# Patient Record
Sex: Female | Born: 1950 | Race: White | Hispanic: No | Smoking: Never smoker
Health system: Southern US, Community
[De-identification: ages and names within clinical notes are randomized; demographics above are authoritative.]

## PROBLEM LIST (undated history)

## (undated) DIAGNOSIS — G43909 Migraine, unspecified, not intractable, without status migrainosus: Secondary | ICD-10-CM

## (undated) DIAGNOSIS — M170 Bilateral primary osteoarthritis of knee: Secondary | ICD-10-CM

---

## 1980-04-04 HISTORY — PX: ABDOMINAL HYSTERECTOMY: SHX81

## 2001-09-13 ENCOUNTER — Encounter: Payer: Self-pay | Admitting: Emergency Medicine

## 2001-09-13 ENCOUNTER — Encounter: Payer: Self-pay | Admitting: Urology

## 2001-09-13 ENCOUNTER — Ambulatory Visit (HOSPITAL_COMMUNITY): Admission: AD | Admit: 2001-09-13 | Discharge: 2001-09-13 | Payer: Self-pay | Admitting: Urology

## 2001-09-13 ENCOUNTER — Emergency Department (HOSPITAL_COMMUNITY): Admission: EM | Admit: 2001-09-13 | Discharge: 2001-09-13 | Payer: Self-pay | Admitting: Emergency Medicine

## 2001-09-24 ENCOUNTER — Ambulatory Visit (HOSPITAL_BASED_OUTPATIENT_CLINIC_OR_DEPARTMENT_OTHER): Admission: RE | Admit: 2001-09-24 | Discharge: 2001-09-24 | Payer: Self-pay | Admitting: Urology

## 2001-09-24 ENCOUNTER — Encounter: Payer: Self-pay | Admitting: Urology

## 2004-10-19 ENCOUNTER — Ambulatory Visit: Payer: Self-pay | Admitting: Unknown Physician Specialty

## 2004-10-26 ENCOUNTER — Ambulatory Visit: Payer: Self-pay | Admitting: Unknown Physician Specialty

## 2005-01-13 ENCOUNTER — Ambulatory Visit: Payer: Self-pay | Admitting: Unknown Physician Specialty

## 2005-04-04 HISTORY — PX: BREAST BIOPSY: SHX20

## 2005-10-24 ENCOUNTER — Ambulatory Visit: Payer: Self-pay | Admitting: Unknown Physician Specialty

## 2005-11-08 ENCOUNTER — Ambulatory Visit: Payer: Self-pay | Admitting: Unknown Physician Specialty

## 2007-12-27 ENCOUNTER — Ambulatory Visit: Payer: Self-pay | Admitting: Unknown Physician Specialty

## 2008-12-22 ENCOUNTER — Ambulatory Visit: Payer: Self-pay | Admitting: Unknown Physician Specialty

## 2008-12-30 ENCOUNTER — Ambulatory Visit: Payer: Self-pay | Admitting: Unknown Physician Specialty

## 2009-04-04 HISTORY — PX: KNEE ARTHROPLASTY: SHX992

## 2010-01-15 ENCOUNTER — Inpatient Hospital Stay (HOSPITAL_COMMUNITY): Admission: RE | Admit: 2010-01-15 | Discharge: 2010-01-18 | Payer: Self-pay | Admitting: Orthopedic Surgery

## 2010-02-23 ENCOUNTER — Ambulatory Visit: Payer: Self-pay | Admitting: Unknown Physician Specialty

## 2010-06-16 LAB — CBC
HCT: 28.6 % — ABNORMAL LOW (ref 36.0–46.0)
Hemoglobin: 9.3 g/dL — ABNORMAL LOW (ref 12.0–15.0)
MCH: 30 pg (ref 26.0–34.0)
MCHC: 32.9 g/dL (ref 30.0–36.0)
MCV: 89.1 fL (ref 78.0–100.0)
Platelets: 173 10*3/uL (ref 150–400)
Platelets: 179 10*3/uL (ref 150–400)
RBC: 3.09 MIL/uL — ABNORMAL LOW (ref 3.87–5.11)
RBC: 3.21 MIL/uL — ABNORMAL LOW (ref 3.87–5.11)
RBC: 3.23 MIL/uL — ABNORMAL LOW (ref 3.87–5.11)
WBC: 8.8 10*3/uL (ref 4.0–10.5)
WBC: 8.8 10*3/uL (ref 4.0–10.5)

## 2010-06-16 LAB — BASIC METABOLIC PANEL
BUN: 4 mg/dL — ABNORMAL LOW (ref 6–23)
CO2: 30 mEq/L (ref 19–32)
Calcium: 8.1 mg/dL — ABNORMAL LOW (ref 8.4–10.5)
Calcium: 8.3 mg/dL — ABNORMAL LOW (ref 8.4–10.5)
Chloride: 104 mEq/L (ref 96–112)
Chloride: 105 mEq/L (ref 96–112)
Chloride: 107 mEq/L (ref 96–112)
Creatinine, Ser: 0.62 mg/dL (ref 0.4–1.2)
Creatinine, Ser: 0.71 mg/dL (ref 0.4–1.2)
GFR calc Af Amer: >60 mL/min (ref >60)
GFR calc Af Amer: >60 mL/min (ref >60)
GFR calc Af Amer: >60 mL/min (ref >60)
GFR calc non Af Amer: >60 mL/min (ref >60)
Glucose, Bld: 118 mg/dL — ABNORMAL HIGH (ref 70–99)
Potassium: 3.8 mEq/L (ref 3.5–5.1)
Potassium: 4.1 mEq/L (ref 3.5–5.1)
Sodium: 137 mEq/L (ref 135–145)
Sodium: 140 mEq/L (ref 135–145)

## 2010-06-17 LAB — URINE MICROSCOPIC-ADD ON

## 2010-06-17 LAB — URINE CULTURE
Colony Count: 75000
Culture  Setup Time: 201110101052

## 2010-06-17 LAB — ABO/RH: ABO/RH(D): A POS

## 2010-06-17 LAB — CBC
HCT: 38.4 % (ref 36.0–46.0)
Hemoglobin: 12.7 g/dL (ref 12.0–15.0)
MCH: 29.9 pg (ref 26.0–34.0)
MCHC: 33.1 g/dL (ref 30.0–36.0)
MCV: 90.4 fL (ref 78.0–100.0)

## 2010-06-17 LAB — COMPREHENSIVE METABOLIC PANEL
ALT: 16 U/L (ref 0–35)
CO2: 31 mEq/L (ref 19–32)
Calcium: 9.3 mg/dL (ref 8.4–10.5)
Creatinine, Ser: 0.72 mg/dL (ref 0.4–1.2)
GFR calc non Af Amer: >60 mL/min (ref >60)
Glucose, Bld: 89 mg/dL (ref 70–99)
Sodium: 141 mEq/L (ref 135–145)

## 2010-06-17 LAB — TYPE AND SCREEN

## 2010-06-17 LAB — URINALYSIS, ROUTINE W REFLEX MICROSCOPIC
Bilirubin Urine: NEGATIVE
Ketones, ur: NEGATIVE mg/dL
Protein, ur: NEGATIVE mg/dL
Urobilinogen, UA: 0.2 mg/dL (ref 0.0–1.0)

## 2010-06-17 LAB — PROTIME-INR
INR: 0.95 (ref 0.00–1.49)
Prothrombin Time: 12.9 seconds (ref 11.6–15.2)

## 2010-06-17 LAB — DIFFERENTIAL
Eosinophils Absolute: 0.2 10*3/uL (ref 0.0–0.7)
Lymphs Abs: 2.7 10*3/uL (ref 0.7–4.0)
Neutrophils Relative %: 54 % (ref 43–77)

## 2010-08-20 NOTE — Op Note (Signed)
[facility]  Patient:    Erica Bernard, Erica Bernard Visit Number: [id number] MRN: [id number]          Service Type: DSU Location: DAY Attending Physician:  Monica Becton Dictated by:   Claudette Laws, M.D. Proc. Date: 09/13/01 Admit Date:  09/13/2001 Discharge Date: 09/13/2001                             Operative Report  PREOPERATIVE DIAGNOSIS:  A 7 x 4 mm mid right ureteral obstructing calculus with renal colic.  POSTOPERATIVE DIAGNOSIS:  A 7 x 4 mm mid right ureteral obstructing calculus with renal colic.  OPERATION PERFORMED:  Cystoscopy, right retrograde pyeloureterogram and insertion of a 6 French 26 cm double J stent.  SURGEON:  Claudette Laws, M.D.  HISTORY OF PRESENT ILLNESS:  This is a 61 year old lady who awoke early this morning with sided colicky pain and some nausea. She presented to the [facility] Room where a CAT scan showed an obstructing mid right ureteral calculus with a high grade right hydronephrosis. I then saw her in the office in the afternoon, we went over the findings. She was having a lot of pain and I recommended that we proceed to insertion of a double J stent. The nature of the procedure was explained to her including complications and she agrees to the proposed surgery. She has had no fever or chills. The patient otherwise is in good health except for a history of migraines and takes a beta blocker. Her urine in our office showed too numerous to count RBCs but no bacteriuria.  DESCRIPTION OF PROCEDURE:  The patient was prepped and draped in the dorsal lithotomy position under endotracheal general anesthesia. Cystoscopy showed a normal bladder but some erythema of the right ureteral orifice, no tumors, no other calculi, and normal left ureteral orifice.  After several attempts to try to intubate the right ureteral orifice with a Glidewire, I switched over to a 0.038 regular guidewire and I was able  to intubate the ureter. I passed up a 6 [place] open ended ureteral catheter and obtained a retrograde study which showed hydronephrosis in the stone in the mid ureter, a rather tortuous right ureter.  At this point, I switched back to the [facility] which went up easily into the right kidney using fluoroscopic control. I then went passed up a 6 [place] 26 cm double J stent. The proximal end was curled up in the renal pelvis, the distal end was curled up in the bladder. All instruments were removed, the bladder was emptied and the patient was taken back to the recovery room having tolerated the procedure well. A B&O suppository was placed per rectum preop for bladder spasms. The patient is to go home later on this evening. Dictated by:   Claudette Laws, M.D. Attending Physician:  Monica Becton DD:  09/13/01 TD:  09/15/01 Job: [id number]

## 2010-10-21 ENCOUNTER — Ambulatory Visit: Payer: Self-pay | Admitting: Unknown Physician Specialty

## 2011-03-08 ENCOUNTER — Ambulatory Visit: Payer: Self-pay | Admitting: Unknown Physician Specialty

## 2012-03-22 ENCOUNTER — Ambulatory Visit: Payer: Self-pay | Admitting: Unknown Physician Specialty

## 2012-11-19 ENCOUNTER — Encounter (HOSPITAL_COMMUNITY): Payer: Self-pay | Admitting: Pharmacy Technician

## 2012-11-20 ENCOUNTER — Other Ambulatory Visit: Payer: Self-pay | Admitting: Physician Assistant

## 2012-11-20 NOTE — H&P (Signed)
TOTAL KNEE ADMISSION H&P  Patient is being admitted for right total knee arthroplasty.  Subjective:  Chief Complaint:right knee pain.  HPI: Erica Bernard, 62 y.o. female, has a history of pain and functional disability in the right knee due to arthritis and has failed non-surgical conservative treatments for greater than 12 weeks to includeNSAID's and/or analgesics, corticosteriod injections, use of assistive devices and activity modification.  Onset of symptoms was gradual, starting 8 years ago with gradually worsening course since that time. The patient noted no past surgery on the right knee(s).  Patient currently rates pain in the right knee(s) at 8 out of 10 with activity. Patient has night pain, worsening of pain with activity and weight bearing, pain that interferes with activities of daily living, pain with passive range of motion and crepitus.  Patient has evidence of periarticular osteophytes, joint space narrowing and valgus deformity by imaging studies.  There is no active infection.  There are no active problems to display for this patient.  No past medical history on file.  No past surgical history on file.   (Not in a hospital admission) Allergies  Allergen Reactions  . Iodine Itching    Rash  . Sulfa Antibiotics Rash    Itching    History  Substance Use Topics  . Smoking status: Not on file  . Smokeless tobacco: Not on file  . Alcohol Use: Not on file    No family history on file.   Review of Systems  Constitutional: Negative.   HENT: Negative for hearing loss, ear pain, nosebleeds, congestion, sore throat, tinnitus and ear discharge.   Eyes: Negative.   Respiratory: Negative.  Negative for stridor.   Cardiovascular: Negative.   Gastrointestinal: Negative.   Genitourinary: Negative.   Musculoskeletal: Positive for joint pain. Negative for falls.  Skin: Negative.   Neurological: Positive for headaches. Negative for dizziness, tingling, tremors, sensory change,  speech change, focal weakness, seizures and loss of consciousness.  Endo/Heme/Allergies: Negative.   Psychiatric/Behavioral: Negative.     Objective:  Physical Exam  Constitutional: She is oriented to person, place, and time. She appears well-developed and well-nourished. No distress.  HENT:  Head: Normocephalic and atraumatic.  Nose: Nose normal.  Eyes: Conjunctivae and EOM are normal. Pupils are equal, round, and reactive to light.  Neck: Normal range of motion. Neck supple.  Cardiovascular: Normal rate, regular rhythm, normal heart sounds and intact distal pulses.   Respiratory: Effort normal and breath sounds normal. No respiratory distress. She has no wheezes. She has no rales.  GI: Soft. Bowel sounds are normal. She exhibits no distension. There is no tenderness.  Musculoskeletal:  RLE- dorsiflexion/plantarflexion ankle intact, SILT in all dermatomes, intact distal pulses, knee joint line tenderness, positive patellofemoral crepitus, stable ligamentous testing, valgus knee  Lymphadenopathy:    She has no cervical adenopathy.  Neurological: She is alert and oriented to person, place, and time. No cranial nerve deficit.  Skin: Skin is warm and dry. No rash noted. No erythema.  Psychiatric: She has a normal mood and affect. Her behavior is normal.    Vital signs in last 24 hours: @VSRANGES@  Labs:   There is no height or weight on file to calculate BMI.   Imaging Review Plain radiographs demonstrate severe degenerative joint disease of the right knee(s). The overall alignment issignificant valgus. The bone quality appears to be good for age and reported activity level.  Assessment/Plan:  End stage arthritis, right knee   The patient history, physical examination,   clinical judgment of the provider and imaging studies are consistent with end stage degenerative joint disease of the right knee(s) and total knee arthroplasty is deemed medically necessary. The treatment options  including medical management, injection therapy arthroscopy and arthroplasty were discussed at length. The risks and benefits of total knee arthroplasty were presented and reviewed. The risks due to aseptic loosening, infection, stiffness, patella tracking problems, thromboembolic complications and other imponderables were discussed. The patient acknowledged the explanation, agreed to proceed with the plan and consent was signed. Patient is being admitted for inpatient treatment for surgery, pain control, PT, OT, prophylactic antibiotics, VTE prophylaxis, progressive ambulation and ADL's and discharge planning. The patient is planning to be discharged to skilled nursing facility [facility] [place].  No Iodine to be used for prescrub due to allergy.     

## 2012-11-23 ENCOUNTER — Encounter (HOSPITAL_COMMUNITY)
Admission: RE | Admit: 2012-11-23 | Discharge: 2012-11-23 | Disposition: A | Discharge status: Home / Self Care | Payer: 59 | Source: Ambulatory Visit | Attending: Orthopedic Surgery | Admitting: Orthopedic Surgery

## 2012-11-23 ENCOUNTER — Ambulatory Visit (HOSPITAL_COMMUNITY)
Admission: RE | Admit: 2012-11-23 | Discharge: 2012-11-23 | Disposition: A | Discharge status: Home / Self Care | Payer: 59 | Source: Ambulatory Visit | Attending: Physician Assistant | Admitting: Physician Assistant

## 2012-11-23 ENCOUNTER — Encounter (HOSPITAL_COMMUNITY): Payer: Self-pay

## 2012-11-23 DIAGNOSIS — M1710 Unilateral primary osteoarthritis, unspecified knee: Secondary | ICD-10-CM | POA: Insufficient documentation

## 2012-11-23 DIAGNOSIS — Z01812 Encounter for preprocedural laboratory examination: Secondary | ICD-10-CM | POA: Insufficient documentation

## 2012-11-23 DIAGNOSIS — M4120 Other idiopathic scoliosis, site unspecified: Secondary | ICD-10-CM | POA: Insufficient documentation

## 2012-11-23 DIAGNOSIS — IMO0002 Reserved for concepts with insufficient information to code with codable children: Secondary | ICD-10-CM | POA: Insufficient documentation

## 2012-11-23 DIAGNOSIS — Z01818 Encounter for other preprocedural examination: Secondary | ICD-10-CM | POA: Insufficient documentation

## 2012-11-23 HISTORY — DX: Bilateral primary osteoarthritis of knee: M17.0

## 2012-11-23 HISTORY — DX: Migraine, unspecified, not intractable, without status migrainosus: G43.909

## 2012-11-23 LAB — PROTIME-INR: INR: 0.97 (ref 0.00–1.49)

## 2012-11-23 LAB — CBC WITH DIFFERENTIAL/PLATELET
Basophils Absolute: 0 10*3/uL (ref 0.0–0.1)
Basophils Relative: 1 % (ref 0–1)
Eosinophils Absolute: 0.1 10*3/uL (ref 0.0–0.7)
Eosinophils Relative: 2 % (ref 0–5)
HCT: 38.2 % (ref 36.0–46.0)
Lymphocytes Relative: 32 % (ref 12–46)
MCHC: 34.6 g/dL (ref 30.0–36.0)
MCV: 89.5 fL (ref 78.0–100.0)
Monocytes Absolute: 0.6 10*3/uL (ref 0.1–1.0)
Platelets: 263 10*3/uL (ref 150–400)
RDW: 13.4 % (ref 11.5–15.5)
WBC: 7.9 10*3/uL (ref 4.0–10.5)

## 2012-11-23 LAB — COMPREHENSIVE METABOLIC PANEL
ALT: 14 U/L (ref 0–35)
AST: 21 U/L (ref 0–37)
Albumin: 3.9 g/dL (ref 3.5–5.2)
CO2: 26 mEq/L (ref 19–32)
Calcium: 9.5 mg/dL (ref 8.4–10.5)
Creatinine, Ser: 0.67 mg/dL (ref 0.50–1.10)
GFR calc non Af Amer: >90 mL/min (ref >90)
Sodium: 139 mEq/L (ref 135–145)
Total Protein: 7.8 g/dL (ref 6.0–8.3)

## 2012-11-23 LAB — URINALYSIS, ROUTINE W REFLEX MICROSCOPIC
Glucose, UA: NEGATIVE mg/dL
Ketones, ur: NEGATIVE mg/dL
Protein, ur: NEGATIVE mg/dL
Urobilinogen, UA: 0.2 mg/dL (ref 0.0–1.0)

## 2012-11-23 LAB — TYPE AND SCREEN
ABO/RH(D): A POS
Antibody Screen: NEGATIVE

## 2012-11-23 LAB — URINE MICROSCOPIC-ADD ON

## 2012-11-23 LAB — SURGICAL PCR SCREEN
MRSA, PCR: NEGATIVE
Staphylococcus aureus: POSITIVE — AB

## 2012-11-23 NOTE — Pre-Procedure Instructions (Signed)
Annalyse Langlais Reader  11/23/2012   Your procedure is scheduled on:  August 29  Report to [facility] at 05:30 AM.  Call this number if you have problems the morning of surgery: [phone]   Remember:   Do not eat food or drink liquids after midnight.   Take these medicines the morning of surgery with A SIP OF WATER: Nadolol, Fioricet (if needed)     Do not take Aspirin, Aleve, Naproxen, Advil, Ibuprofen, Vitamin, Herbs, or Supplements starting today  Do not wear jewelry, make-up or nail polish.  Do not wear lotions, powders, or perfumes. You Whitcher wear deodorant.  Do not shave 48 hours prior to surgery. Men Gombert shave face and neck.  Do not bring valuables to the hospital.  [facility] is not responsible                   for any belongings or valuables.  Contacts, dentures or bridgework Harnisch not be worn into surgery.  Leave suitcase in the car. After surgery it Brister be brought to your room.  For patients admitted to the hospital, checkout time is 11:00 AM the day of  discharge.   Special Instructions: Shower using CHG 2 nights before surgery and the night before surgery.  If you shower the day of surgery use CHG.  Use special wash - you have one bottle of CHG for all showers.  You should use approximately 1/3 of the bottle for each shower.   Please read over the following fact sheets that you were given: Pain Booklet, Coughing and Deep Breathing, Blood Transfusion Information, Total Joint Packet, MRSA Information and Surgical Site Infection Prevention

## 2012-11-23 NOTE — Progress Notes (Signed)
Requested 2 most recent EKG from [facility] clinic and last OV per copy of clearance note that pt brought in she has changes on EKG that have been present for > 5 years

## 2012-11-24 LAB — URINE CULTURE

## 2012-11-29 MED ORDER — CEFAZOLIN SODIUM-DEXTROSE 2-3 GM-% IV SOLR
2.0000 g | INTRAVENOUS | Status: AC
  Administered 2012-11-30: 2 g via INTRAVENOUS
  Filled 2012-11-29: qty 50

## 2012-11-30 ENCOUNTER — Encounter (HOSPITAL_COMMUNITY): Payer: Self-pay | Admitting: Anesthesiology

## 2012-11-30 ENCOUNTER — Ambulatory Visit (HOSPITAL_COMMUNITY): Payer: 59 | Admitting: Anesthesiology

## 2012-11-30 ENCOUNTER — Encounter (HOSPITAL_COMMUNITY): Payer: Self-pay | Admitting: *Deleted

## 2012-11-30 ENCOUNTER — Inpatient Hospital Stay (HOSPITAL_COMMUNITY)
Admission: RE | Admit: 2012-11-30 | Discharge: 2012-12-04 | DRG: 470 | Disposition: A | Discharge status: Skilled Nursing Facility | Payer: 59 | Source: Ambulatory Visit | Attending: Orthopedic Surgery | Admitting: Orthopedic Surgery

## 2012-11-30 ENCOUNTER — Encounter (HOSPITAL_COMMUNITY)
Admission: RE | Disposition: A | Discharge status: Skilled Nursing Facility | Payer: Self-pay | Source: Ambulatory Visit | Attending: Orthopedic Surgery

## 2012-11-30 DIAGNOSIS — Z79899 Other long term (current) drug therapy: Secondary | ICD-10-CM

## 2012-11-30 DIAGNOSIS — M1710 Unilateral primary osteoarthritis, unspecified knee: Principal | ICD-10-CM | POA: Diagnosis present

## 2012-11-30 DIAGNOSIS — D62 Acute posthemorrhagic anemia: Secondary | ICD-10-CM | POA: Diagnosis not present

## 2012-11-30 DIAGNOSIS — Z882 Allergy status to sulfonamides status: Secondary | ICD-10-CM

## 2012-11-30 DIAGNOSIS — Z7901 Long term (current) use of anticoagulants: Secondary | ICD-10-CM

## 2012-11-30 DIAGNOSIS — G43909 Migraine, unspecified, not intractable, without status migrainosus: Secondary | ICD-10-CM | POA: Diagnosis present

## 2012-11-30 HISTORY — PX: TOTAL KNEE ARTHROPLASTY: SHX125

## 2012-11-30 LAB — CBC
HCT: 34.6 % — ABNORMAL LOW (ref 36.0–46.0)
MCHC: 34.4 g/dL (ref 30.0–36.0)
MCV: 90.3 fL (ref 78.0–100.0)
Platelets: 232 10*3/uL (ref 150–400)
RDW: 13.3 % (ref 11.5–15.5)
WBC: 15.2 10*3/uL — ABNORMAL HIGH (ref 4.0–10.5)

## 2012-11-30 LAB — CREATININE, SERUM: GFR calc Af Amer: >90 mL/min (ref >90)

## 2012-11-30 SURGERY — ARTHROPLASTY, KNEE, TOTAL
Anesthesia: Regional | Site: Knee | Laterality: Right | Wound class: Clean

## 2012-11-30 MED ORDER — HYDROMORPHONE HCL PF 1 MG/ML IJ SOLN: 0.2500 mg | INTRAMUSCULAR | Status: DC | PRN

## 2012-11-30 MED ORDER — MIDAZOLAM HCL 2 MG/2ML IJ SOLN: 0.5000 mg | Freq: Once | INTRAMUSCULAR | Status: DC | PRN

## 2012-11-30 MED ORDER — DOCUSATE SODIUM 100 MG PO CAPS
100.0000 mg | ORAL_CAPSULE | Freq: Two times a day (BID) | ORAL | Status: DC
  Administered 2012-11-30 – 2012-12-04 (×9): 100 mg via ORAL
  Filled 2012-11-30 (×10): qty 1

## 2012-11-30 MED ORDER — TRANEXAMIC ACID 100 MG/ML IV SOLN
1000.0000 mg | INTRAVENOUS | Status: AC
  Administered 2012-11-30: 1000 mg via INTRAVENOUS
  Filled 2012-11-30: qty 10

## 2012-11-30 MED ORDER — SODIUM CHLORIDE 0.9 % IR SOLN
Status: DC | PRN
  Administered 2012-11-30: 3000 mL

## 2012-11-30 MED ORDER — CHLORHEXIDINE GLUCONATE 4 % EX LIQD: 60.0000 mL | Freq: Once | CUTANEOUS | Status: DC

## 2012-11-30 MED ORDER — PROPOFOL 10 MG/ML IV BOLUS
INTRAVENOUS | Status: DC | PRN
  Administered 2012-11-30: 50 mg via INTRAVENOUS
  Administered 2012-11-30: 200 mg via INTRAVENOUS

## 2012-11-30 MED ORDER — ACETAMINOPHEN 325 MG PO TABS
650.0000 mg | ORAL_TABLET | Freq: Four times a day (QID) | ORAL | Status: DC | PRN
  Administered 2012-12-01 – 2012-12-03 (×4): 650 mg via ORAL
  Filled 2012-11-30 (×4): qty 2

## 2012-11-30 MED ORDER — CEFAZOLIN SODIUM 1-5 GM-% IV SOLN
1.0000 g | Freq: Four times a day (QID) | INTRAVENOUS | Status: AC
  Administered 2012-11-30 (×2): 1 g via INTRAVENOUS
  Filled 2012-11-30 (×2): qty 50

## 2012-11-30 MED ORDER — ONDANSETRON HCL 4 MG PO TABS: 4.0000 mg | ORAL_TABLET | Freq: Four times a day (QID) | ORAL | Status: DC | PRN

## 2012-11-30 MED ORDER — BISACODYL 10 MG RE SUPP: 10.0000 mg | Freq: Every day | RECTAL | Status: DC | PRN

## 2012-11-30 MED ORDER — OXYCODONE HCL 5 MG/5ML PO SOLN: 5.0000 mg | Freq: Once | ORAL | Status: AC | PRN

## 2012-11-30 MED ORDER — NADOLOL 20 MG PO TABS
20.0000 mg | ORAL_TABLET | Freq: Every day | ORAL | Status: DC
  Administered 2012-11-30 – 2012-12-04 (×5): 20 mg via ORAL
  Filled 2012-11-30 (×5): qty 1

## 2012-11-30 MED ORDER — ARTIFICIAL TEARS OP OINT
TOPICAL_OINTMENT | OPHTHALMIC | Status: DC | PRN
  Administered 2012-11-30: 1 via OPHTHALMIC

## 2012-11-30 MED ORDER — PHENOL 1.4 % MT LIQD: 1.0000 | OROMUCOSAL | Status: DC | PRN

## 2012-11-30 MED ORDER — MENTHOL 3 MG MT LOZG: 1.0000 | LOZENGE | OROMUCOSAL | Status: DC | PRN

## 2012-11-30 MED ORDER — OXYCODONE HCL 5 MG PO TABS
ORAL_TABLET | ORAL | Status: AC
  Administered 2012-11-30: 13:00:00
  Filled 2012-11-30: qty 1

## 2012-11-30 MED ORDER — OXYCODONE HCL 5 MG PO TABS
5.0000 mg | ORAL_TABLET | ORAL | Status: DC | PRN
  Administered 2012-11-30: 5 mg via ORAL
  Administered 2012-11-30 – 2012-12-04 (×16): 10 mg via ORAL
  Filled 2012-11-30 (×16): qty 2
  Filled 2012-11-30: qty 1

## 2012-11-30 MED ORDER — FLEET ENEMA 7-19 GM/118ML RE ENEM: 1.0000 | ENEMA | Freq: Once | RECTAL | Status: AC | PRN

## 2012-11-30 MED ORDER — SODIUM CHLORIDE 0.9 % IV SOLN: INTRAVENOUS | Status: DC

## 2012-11-30 MED ORDER — LIDOCAINE HCL (CARDIAC) 20 MG/ML IV SOLN
INTRAVENOUS | Status: DC | PRN
  Administered 2012-11-30: 20 mg via INTRAVENOUS

## 2012-11-30 MED ORDER — ONDANSETRON HCL 4 MG/2ML IJ SOLN
4.0000 mg | Freq: Four times a day (QID) | INTRAMUSCULAR | Status: DC | PRN
  Administered 2012-11-30 – 2012-12-01 (×3): 4 mg via INTRAVENOUS
  Filled 2012-11-30 (×3): qty 2

## 2012-11-30 MED ORDER — PROMETHAZINE HCL 25 MG/ML IJ SOLN: 6.2500 mg | INTRAMUSCULAR | Status: DC | PRN

## 2012-11-30 MED ORDER — MEPERIDINE HCL 25 MG/ML IJ SOLN: 6.2500 mg | INTRAMUSCULAR | Status: DC | PRN

## 2012-11-30 MED ORDER — SENNOSIDES-DOCUSATE SODIUM 8.6-50 MG PO TABS: 1.0000 | ORAL_TABLET | Freq: Every evening | ORAL | Status: DC | PRN

## 2012-11-30 MED ORDER — METOCLOPRAMIDE HCL 5 MG PO TABS
5.0000 mg | ORAL_TABLET | Freq: Three times a day (TID) | ORAL | Status: DC | PRN
  Filled 2012-11-30: qty 2

## 2012-11-30 MED ORDER — SODIUM CHLORIDE 0.9 % IV SOLN
INTRAVENOUS | Status: DC
  Administered 2012-11-30: via INTRAVENOUS

## 2012-11-30 MED ORDER — MIDAZOLAM HCL 5 MG/5ML IJ SOLN
INTRAMUSCULAR | Status: DC | PRN
  Administered 2012-11-30: 2 mg via INTRAVENOUS

## 2012-11-30 MED ORDER — METOCLOPRAMIDE HCL 5 MG/ML IJ SOLN: 5.0000 mg | Freq: Three times a day (TID) | INTRAMUSCULAR | Status: DC | PRN

## 2012-11-30 MED ORDER — FENTANYL CITRATE 0.05 MG/ML IJ SOLN
INTRAMUSCULAR | Status: DC | PRN
  Administered 2012-11-30: 100 ug via INTRAVENOUS
  Administered 2012-11-30: 50 ug via INTRAVENOUS
  Administered 2012-11-30: 100 ug via INTRAVENOUS
  Administered 2012-11-30: 50 ug via INTRAVENOUS

## 2012-11-30 MED ORDER — LACTATED RINGERS IV SOLN
INTRAVENOUS | Status: DC | PRN
  Administered 2012-11-30 (×2): via INTRAVENOUS

## 2012-11-30 MED ORDER — 0.9 % SODIUM CHLORIDE (POUR BTL) OPTIME
TOPICAL | Status: DC | PRN
  Administered 2012-11-30: 1000 mL

## 2012-11-30 MED ORDER — ACETAMINOPHEN 650 MG RE SUPP: 650.0000 mg | Freq: Four times a day (QID) | RECTAL | Status: DC | PRN

## 2012-11-30 MED ORDER — BUTALBITAL-APAP-CAFFEINE 50-325-40 MG PO TABS: 1.0000 | ORAL_TABLET | Freq: Once | ORAL | Status: AC | PRN

## 2012-11-30 MED ORDER — ONDANSETRON HCL 4 MG/2ML IJ SOLN
INTRAMUSCULAR | Status: DC | PRN
  Administered 2012-11-30: 4 mg via INTRAVENOUS

## 2012-11-30 MED ORDER — ENOXAPARIN SODIUM 30 MG/0.3ML ~~LOC~~ SOLN
30.0000 mg | Freq: Two times a day (BID) | SUBCUTANEOUS | Status: DC
  Administered 2012-12-01 – 2012-12-04 (×7): 30 mg via SUBCUTANEOUS
  Filled 2012-11-30 (×9): qty 0.3

## 2012-11-30 MED ORDER — HYDROMORPHONE HCL PF 1 MG/ML IJ SOLN
1.0000 mg | INTRAMUSCULAR | Status: DC | PRN
  Administered 2012-12-01: 1 mg via INTRAVENOUS
  Filled 2012-11-30: qty 1

## 2012-11-30 MED ORDER — ATORVASTATIN CALCIUM 10 MG PO TABS
10.0000 mg | ORAL_TABLET | ORAL | Status: DC
  Administered 2012-12-02: 10 mg via ORAL
  Filled 2012-11-30 (×3): qty 1

## 2012-11-30 MED ORDER — BUPIVACAINE-EPINEPHRINE PF 0.5-1:200000 % IJ SOLN
INTRAMUSCULAR | Status: DC | PRN
  Administered 2012-11-30: 30 mL

## 2012-11-30 MED ORDER — OXYCODONE HCL 5 MG PO TABS
5.0000 mg | ORAL_TABLET | Freq: Once | ORAL | Status: AC | PRN
  Administered 2012-11-30: 5 mg via ORAL

## 2012-11-30 SURGICAL SUPPLY — 63 items
BANDAGE ELASTIC 4 VELCRO ST LF (GAUZE/BANDAGES/DRESSINGS) ×2 IMPLANT
BANDAGE ELASTIC 6 VELCRO ST LF (GAUZE/BANDAGES/DRESSINGS) ×2 IMPLANT
BANDAGE ESMARK 6X9 LF (GAUZE/BANDAGES/DRESSINGS) ×1 IMPLANT
BLADE SAGITTAL 25.0X1.19X90 (BLADE) ×2 IMPLANT
BLADE SAW SAG 90X13X1.27 (BLADE) ×2 IMPLANT
BNDG CMPR 9X6 STRL LF SNTH (GAUZE/BANDAGES/DRESSINGS) ×1
BNDG ESMARK 6X9 LF (GAUZE/BANDAGES/DRESSINGS) ×2
BOWL SMART MIX CTS (DISPOSABLE) ×2 IMPLANT
CAPT RP KNEE ×1 IMPLANT
CEMENT HV SMART SET (Cement) ×2 IMPLANT
CHLORAPREP W/TINT 26ML (MISCELLANEOUS) ×1 IMPLANT
CLOTH BEACON ORANGE TIMEOUT ST (SAFETY) ×2 IMPLANT
COVER SURGICAL LIGHT HANDLE (MISCELLANEOUS) ×2 IMPLANT
CUFF TOURNIQUET SINGLE 34IN LL (TOURNIQUET CUFF) ×2 IMPLANT
CUFF TOURNIQUET SINGLE 44IN (TOURNIQUET CUFF) IMPLANT
DRAPE INCISE IOBAN 66X45 STRL (DRAPES) IMPLANT
DRAPE ORTHO SPLIT 77X108 STRL (DRAPES) ×4
DRAPE SURG ORHT 6 SPLT 77X108 (DRAPES) ×2 IMPLANT
DRAPE U-SHAPE 47X51 STRL (DRAPES) ×2 IMPLANT
DRSG ADAPTIC 3X8 NADH LF (GAUZE/BANDAGES/DRESSINGS) ×2 IMPLANT
DRSG PAD ABDOMINAL 8X10 ST (GAUZE/BANDAGES/DRESSINGS) ×3 IMPLANT
DURAPREP 26ML APPLICATOR (WOUND CARE) ×1 IMPLANT
ELECT REM PT RETURN 9FT ADLT (ELECTROSURGICAL) ×2
ELECTRODE REM PT RTRN 9FT ADLT (ELECTROSURGICAL) ×1 IMPLANT
EVACUATOR 1/8 PVC DRAIN (DRAIN) ×2 IMPLANT
FACESHIELD LNG OPTICON STERILE (SAFETY) ×4 IMPLANT
FLOSEAL 10ML (HEMOSTASIS) IMPLANT
GLOVE BIOGEL PI IND STRL 8 (GLOVE) ×4 IMPLANT
GLOVE BIOGEL PI INDICATOR 8 (GLOVE) ×4
GLOVE ORTHO TXT STRL SZ7.5 (GLOVE) ×6 IMPLANT
GLOVE SURG ORTHO 8.0 STRL STRW (GLOVE) ×6 IMPLANT
GOWN PREVENTION PLUS XLARGE (GOWN DISPOSABLE) ×2 IMPLANT
GOWN PREVENTION PLUS XXLARGE (GOWN DISPOSABLE) ×2 IMPLANT
GOWN STRL NON-REIN LRG LVL3 (GOWN DISPOSABLE) ×4 IMPLANT
HANDPIECE INTERPULSE COAX TIP (DISPOSABLE) ×2
HOOD PEEL AWAY FACE SHEILD DIS (HOOD) ×2 IMPLANT
IMMOBILIZER KNEE 20 (SOFTGOODS)
IMMOBILIZER KNEE 20 THIGH 36 (SOFTGOODS) IMPLANT
IMMOBILIZER KNEE 22 UNIV (SOFTGOODS) ×1 IMPLANT
KIT BASIN OR (CUSTOM PROCEDURE TRAY) ×2 IMPLANT
KIT ROOM TURNOVER OR (KITS) ×2 IMPLANT
MANIFOLD NEPTUNE II (INSTRUMENTS) ×2 IMPLANT
NEEDLE 22X1 1/2 (OR ONLY) (NEEDLE) IMPLANT
NS IRRIG 1000ML POUR BTL (IV SOLUTION) ×2 IMPLANT
PACK TOTAL JOINT (CUSTOM PROCEDURE TRAY) ×2 IMPLANT
PAD ARMBOARD 7.5X6 YLW CONV (MISCELLANEOUS) ×4 IMPLANT
PAD CAST 4YDX4 CTTN HI CHSV (CAST SUPPLIES) ×1 IMPLANT
PADDING CAST COTTON 4X4 STRL (CAST SUPPLIES) ×2
PADDING CAST COTTON 6X4 STRL (CAST SUPPLIES) ×2 IMPLANT
SET HNDPC FAN SPRY TIP SCT (DISPOSABLE) ×1 IMPLANT
SPONGE GAUZE 4X4 12PLY (GAUZE/BANDAGES/DRESSINGS) ×2 IMPLANT
STAPLER VISISTAT 35W (STAPLE) ×2 IMPLANT
SUCTION FRAZIER TIP 10 FR DISP (SUCTIONS) ×2 IMPLANT
SUT ETHIBOND NAB CT1 #1 30IN (SUTURE) ×6 IMPLANT
SUT VIC AB 0 CT1 27 (SUTURE) ×2
SUT VIC AB 0 CT1 27XBRD ANBCTR (SUTURE) ×1 IMPLANT
SUT VIC AB 2-0 CT1 27 (SUTURE) ×4
SUT VIC AB 2-0 CT1 TAPERPNT 27 (SUTURE) ×2 IMPLANT
SYR CONTROL 10ML LL (SYRINGE) IMPLANT
TOWEL OR 17X24 6PK STRL BLUE (TOWEL DISPOSABLE) ×2 IMPLANT
TOWEL OR 17X26 10 PK STRL BLUE (TOWEL DISPOSABLE) ×2 IMPLANT
TRAY FOLEY CATH 16FRSI W/METER (SET/KITS/TRAYS/PACK) ×2 IMPLANT
WATER STERILE IRR 1000ML POUR (IV SOLUTION) ×4 IMPLANT

## 2012-11-30 NOTE — Anesthesia Postprocedure Evaluation (Signed)
  Anesthesia Post-op Note  Patient: Erica Bernard  Procedure(s) Performed: Procedure(s): RIGHT TOTAL KNEE ARTHROPLASTY WITH MEIDAL AND LATERAL COMPARTMENT WITH PATELLA RESURFACING (Right)  Patient Location: PACU  Anesthesia Type:GA combined with regional for post-op pain  Level of Consciousness: awake, alert , oriented and patient cooperative  Airway and Oxygen Therapy: Patient Spontanous Breathing and Patient connected to nasal cannula oxygen  Post-op Pain: none  Post-op Assessment: Post-op Vital signs reviewed, Patient's Cardiovascular Status Stable, Respiratory Function Stable, Patent Airway, No signs of Nausea or vomiting and Pain level controlled  Post-op Vital Signs: Reviewed and stable  Complications: No apparent anesthesia complications

## 2012-11-30 NOTE — Transfer of Care (Signed)
Immediate Anesthesia Transfer of Care Note  Patient: Erica Bernard  Procedure(s) Performed: Procedure(s): RIGHT TOTAL KNEE ARTHROPLASTY WITH MEIDAL AND LATERAL COMPARTMENT WITH PATELLA RESURFACING (Right)  Patient Location: PACU  Anesthesia Type:General and Regional  Level of Consciousness: awake, alert , oriented and sedated  Airway & Oxygen Therapy: Patient Spontanous Breathing and Patient connected to face mask oxygen  Post-op Assessment: Report given to PACU RN, Post -op Vital signs reviewed and stable and Patient moving all extremities  Post vital signs: Reviewed and stable  Complications: No apparent anesthesia complications

## 2012-11-30 NOTE — H&P (View-Only) (Signed)
TOTAL KNEE ADMISSION H&P  Patient is being admitted for right total knee arthroplasty.  Subjective:  Chief Complaint:right knee pain.  HPI: Erica Bernard, 62 y.o. female, has a history of pain and functional disability in the right knee due to arthritis and has failed non-surgical conservative treatments for greater than 12 weeks to includeNSAID's and/or analgesics, corticosteriod injections, use of assistive devices and activity modification.  Onset of symptoms was gradual, starting 8 years ago with gradually worsening course since that time. The patient noted no past surgery on the right knee(s).  Patient currently rates pain in the right knee(s) at 8 out of 10 with activity. Patient has night pain, worsening of pain with activity and weight bearing, pain that interferes with activities of daily living, pain with passive range of motion and crepitus.  Patient has evidence of periarticular osteophytes, joint space narrowing and valgus deformity by imaging studies.  There is no active infection.  There are no active problems to display for this patient.  No past medical history on file.  No past surgical history on file.   (Not in a hospital admission) Allergies  Allergen Reactions  . Iodine Itching    Rash  . Sulfa Antibiotics Rash    Itching    History  Substance Use Topics  . Smoking status: Not on file  . Smokeless tobacco: Not on file  . Alcohol Use: Not on file    No family history on file.   Review of Systems  Constitutional: Negative.   HENT: Negative for hearing loss, ear pain, nosebleeds, congestion, sore throat, tinnitus and ear discharge.   Eyes: Negative.   Respiratory: Negative.  Negative for stridor.   Cardiovascular: Negative.   Gastrointestinal: Negative.   Genitourinary: Negative.   Musculoskeletal: Positive for joint pain. Negative for falls.  Skin: Negative.   Neurological: Positive for headaches. Negative for dizziness, tingling, tremors, sensory change,  speech change, focal weakness, seizures and loss of consciousness.  Endo/Heme/Allergies: Negative.   Psychiatric/Behavioral: Negative.     Objective:  Physical Exam  Constitutional: She is oriented to person, place, and time. She appears well-developed and well-nourished. No distress.  HENT:  Head: Normocephalic and atraumatic.  Nose: Nose normal.  Eyes: Conjunctivae and EOM are normal. Pupils are equal, round, and reactive to light.  Neck: Normal range of motion. Neck supple.  Cardiovascular: Normal rate, regular rhythm, normal heart sounds and intact distal pulses.   Respiratory: Effort normal and breath sounds normal. No respiratory distress. She has no wheezes. She has no rales.  GI: Soft. Bowel sounds are normal. She exhibits no distension. There is no tenderness.  Musculoskeletal:  RLE- dorsiflexion/plantarflexion ankle intact, SILT in all dermatomes, intact distal pulses, knee joint line tenderness, positive patellofemoral crepitus, stable ligamentous testing, valgus knee  Lymphadenopathy:    She has no cervical adenopathy.  Neurological: She is alert and oriented to person, place, and time. No cranial nerve deficit.  Skin: Skin is warm and dry. No rash noted. No erythema.  Psychiatric: She has a normal mood and affect. Her behavior is normal.    Vital signs in last 24 hours: @VSRANGES @  Labs:   There is no height or weight on file to calculate BMI.   Imaging Review Plain radiographs demonstrate severe degenerative joint disease of the right knee(s). The overall alignment issignificant valgus. The bone quality appears to be good for age and reported activity level.  Assessment/Plan:  End stage arthritis, right knee   The patient history, physical examination,  clinical judgment of the provider and imaging studies are consistent with end stage degenerative joint disease of the right knee(s) and total knee arthroplasty is deemed medically necessary. The treatment options  including medical management, injection therapy arthroscopy and arthroplasty were discussed at length. The risks and benefits of total knee arthroplasty were presented and reviewed. The risks due to aseptic loosening, infection, stiffness, patella tracking problems, thromboembolic complications and other imponderables were discussed. The patient acknowledged the explanation, agreed to proceed with the plan and consent was signed. Patient is being admitted for inpatient treatment for surgery, pain control, PT, OT, prophylactic antibiotics, VTE prophylaxis, progressive ambulation and ADL's and discharge planning. The patient is planning to be discharged to skilled nursing facility [facility].  No Iodine to be used for prescrub due to allergy.

## 2012-11-30 NOTE — Anesthesia Procedure Notes (Signed)
Anesthesia Regional Block:  Femoral nerve block  Pre-Anesthetic Checklist: ,, timeout performed, Correct Patient, Correct Site, Correct Laterality, Correct Procedure, Correct Position, site marked, Risks and benefits discussed,  Surgical consent,  Pre-op evaluation,  At surgeon's request and post-op pain management  Laterality: Right  Prep: chloraprep       Needles:  Injection technique: Single-shot  Needle Type: Stimulator Needle - 40     Needle Length: 4cm  Needle Gauge: 22 and 22 G    Additional Needles:  Procedures: nerve stimulator Femoral nerve block  Nerve Stimulator or Paresthesia:  Response: patella, 0.45 mA, 0.1 ms,   Additional Responses:   Narrative:  Start time: 11/30/2012 7:11 AM End time: 11/30/2012 7:39 AM Injection made incrementally with aspirations every 5 mL.  Performed by: Personally  Anesthesiologist: Sandford Craze, MD  Additional Notes: Pt identified in Holding room.  Monitors applied. Working IV access confirmed. Sterile prep R groin.  #22ga PNS to  twitch at 0.1mA threshold.  30cc 0.5% Bupivacaine with 1:200k epi injected incrementally after negative test dose.  Patient asymptomatic, VSS, no heme aspirated, tolerated well.  Sandford Craze, MD  Femoral nerve block

## 2012-11-30 NOTE — Brief Op Note (Signed)
11/30/2012  10:22 AM  PATIENT:  Erica Bernard  62 y.o. female  PRE-OPERATIVE DIAGNOSIS:  Osteoarthritis Right Knee  POST-OPERATIVE DIAGNOSIS:  Osteoarthritis Right Knee  PROCEDURE:  Procedure(s): RIGHT TOTAL KNEE ARTHROPLASTY WITH MEIDAL AND LATERAL COMPARTMENT WITH PATELLA RESURFACING (Right)  SURGEON:  Surgeon(s) and Role:    * W D Carloyn Manner., MD - Primary  PHYSICIAN ASSISTANT: Margart Sickles, PA-C  ASSISTANTS:   ANESTHESIA:   regional and general  EBL:  Total I/O In: 1300 [I.V.:1300] Out: 150 [Urine:150]  BLOOD ADMINISTERED:none  DRAINS: 1 hemovac drain right lateral knee self suction  LOCAL MEDICATIONS USED:  NONE  SPECIMEN:  No Specimen  DISPOSITION OF SPECIMEN:  N/A  COUNTS:  YES  TOURNIQUET:   Total Tourniquet Time Documented: Thigh (Right) - 1493 minutes Total: Thigh (Right) - 1493 minutes   DICTATION: .Other Dictation: Dictation Number unknown  PLAN OF CARE: Admit to inpatient   PATIENT DISPOSITION:  PACU - hemodynamically stable.   Delay start of Pharmacological VTE agent (>24hrs) due to surgical blood loss or risk of bleeding: yes

## 2012-11-30 NOTE — Progress Notes (Signed)
Orthopedic Tech Progress Note Patient Details:  Erica Bernard May 21, 1950 [id number]  CPM Right Knee CPM Right Knee: On Right Knee Flexion (Degrees): 60 Right Knee Extension (Degrees): 0 Additional Comments: Trapeze bar   Cammer, Mickie Bail 11/30/2012, 11:20 AM

## 2012-11-30 NOTE — OR Nursing (Signed)
resp rate is low , 5-7 bpm/ even , yet shallow at times/ needs encouragement to deep breatghe/stimulation /  Maintaining simple mask and  NPA which patient is not bothered by

## 2012-11-30 NOTE — Evaluation (Signed)
Physical Therapy Evaluation Patient Details Name: Erica Bernard MRN: [id number] DOB: [birth year] Today's Date: 11/30/2012 Time: 1430-1450 PT Time Calculation (min): 20 min  PT Assessment / Plan / Recommendation History of Present Illness  s/p Elective Rt tka   Clinical Impression  Pt is s/p Rt TKA POD #0 resulting in the deficits listed below (see PT Problem List).  Pt will benefit from skilled PT to increase their independence and safety with mobility to allow discharge to the venue listed below. Pt presents with ext lag ~4degrees; c/o 10/10 pain with Rt knee ext. Requires 2+ for SPT today due to pain. O2 stats on RA 95-97%; pt is now on 2L O2 per Orders. Plans to D/C to SNF in [place] prior to returning home alone.     PT Assessment  Patient needs continued PT services    Follow Up Recommendations  SNF;Supervision/Assistance - 24 hour    Does the patient have the potential to tolerate intense rehabilitation      Barriers to Discharge Decreased caregiver support pt lives alone    Equipment Recommendations  Other (comment) (TBD at SNF)    Recommendations for Other Services OT consult   Frequency 7X/week    Precautions / Restrictions Precautions Precautions: Fall;Knee Precaution Booklet Issued: Yes (comment) Precaution Comments: pt given TKA HEP  Required Braces or Orthoses: Knee Immobilizer - Right Knee Immobilizer - Right: On when out of bed or walking Restrictions Weight Bearing Restrictions: Yes RLE Weight Bearing: Weight bearing as tolerated   Pertinent Vitals/Pain 10/10 primarily with Rt knee ext; RN notified. patient repositioned for comfort       Mobility  Bed Mobility Bed Mobility: Supine to Sit;Sitting - Scoot to Edge of Bed Supine to Sit: 4: Min assist;HOB elevated;With rails Sitting - Scoot to Edge of Bed: 4: Min assist Details for Bed Mobility Assistance: (A) to bring Rt LE to/off EOB; cues for hand placement and sequencing; required increased time due  to pain Transfers Transfers: Sit to Stand;Stand to Sit;Stand Pivot Transfers Sit to Stand: 1: +2 Total assist;From elevated surface;From bed;With upper extremity assist Sit to Stand: Patient Percentage: 60% Stand to Sit: 1: +2 Total assist;To chair/3-in-1;With armrests;With upper extremity assist Stand to Sit: Patient Percentage: 50% Stand Pivot Transfers: 1: +2 Total assist;With armrests;From elevated surface Stand Pivot Transfers: Patient Percentage: 50% Details for Transfer Assistance: pt requires 2+ (A) to achieve upright standing position; limited by pain and decr ability to WB through Rt LE at this time due to pain; cues for hand placement and sequencing with RW; pt became fatigued and was in increased pain and attempted to sit prior to reaching chair; pt given max cues for safety and to control descent to chair, however, pt flopped into chair with decr safety awareness due to pain  Ambulation/Gait Ambulation/Gait Assistance: Not tested (comment) Assistive device: Rolling walker Stairs: No Wheelchair Mobility Wheelchair Mobility: No    Exercises Total Joint Exercises Ankle Circles/Pumps: AROM;Both;10 reps;Supine Quad Sets: Other (comment) (pt c/o 10/10 pain with footsie roll and QS )   PT Diagnosis: Difficulty walking;Acute pain  PT Problem List: Decreased strength;Decreased range of motion;Decreased activity tolerance;Decreased balance;Decreased mobility;Decreased knowledge of use of DME;Decreased safety awareness;Decreased knowledge of precautions;Pain PT Treatment Interventions: DME instruction;Gait training;Stair training;Therapeutic activities;Functional mobility training;Therapeutic exercise;Balance training;Neuromuscular re-education;Patient/family education     PT Goals(Current goals can be found in the care plan section) Acute Rehab PT Goals Patient Stated Goal: to be more independent PT Goal Formulation: With patient Potential to Achieve Goals: Good  Visit  Information  Last PT Received On: 11/30/12 Assistance Needed: +2 History of Present Illness: s/p Elective Rt tka        Prior Functioning  Home Living Family/patient expects to be discharged to:: Skilled nursing facility Living Arrangements: Alone Prior Function Level of Independence: Independent Comments: has made arrangements to D[facility] [place] Communication Communication: No difficulties Dominant Hand: Right    Cognition  Cognition Arousal/Alertness: Lethargic;Suspect due to medications Behavior During Therapy: WFL for tasks assessed/performed Overall Cognitive Status: Within Functional Limits for tasks assessed    Extremity/Trunk Assessment Upper Extremity Assessment Upper Extremity Assessment: Defer to OT evaluation Lower Extremity Assessment Lower Extremity Assessment: RLE deficits/detail RLE Deficits / Details: Rt knee limited due to pain; pt with difficulty ext knee; lag ~ 4 degrees; stated she could not get knee straight before surgery RLE: Unable to fully assess due to pain RLE Sensation:  (WFL to light touch) Cervical / Trunk Assessment Cervical / Trunk Assessment: Normal   Balance Balance Balance Assessed: Yes Static Sitting Balance Static Sitting - Balance Support: Bilateral upper extremity supported;Feet supported Static Sitting - Level of Assistance: 5: Stand by assistance Static Sitting - Comment/# of Minutes: tolerated sitting EOB ~3 min to donn gown; pt O2 at 97% sitting EOB  End of Session PT - End of Session Equipment Utilized During Treatment: Gait belt;Right knee immobilizer Activity Tolerance: Patient limited by pain Patient left: in chair;with call bell/phone within reach;with family/visitor present Nurse Communication: Mobility status;Patient requests pain meds CPM Right Knee CPM Right Knee: Off Right Knee Flexion (Degrees): 60 Right Knee Extension (Degrees): 0 Additional Comments: Trapeze bar  GP     Donell Sievert,  [place] [phone] 11/30/2012, 3:02 PM

## 2012-11-30 NOTE — Progress Notes (Signed)
Care of pt assumed by MA Chrissi Crow RN 

## 2012-11-30 NOTE — Progress Notes (Signed)
Pt much more alert & awake. C/o some pain in right knee--med with po OXY IR with good effect. Dr Jean Rosenthal updated. OK to tx pt to rm.

## 2012-11-30 NOTE — Anesthesia Preprocedure Evaluation (Signed)
Anesthesia Evaluation  Patient identified by MRN, date of birth, ID band Patient awake    Reviewed: Allergy & Precautions, H&P , NPO status , Patient's Chart, lab work & pertinent test results, reviewed documented beta blocker date and time   History of Anesthesia Complications Negative for: history of anesthetic complications  Airway Mallampati: II TM Distance: >3 FB Neck ROM: Full    Dental  (+) Dental Advisory Given and Teeth Intact   Pulmonary neg pulmonary ROS,  breath sounds clear to auscultation  Pulmonary exam normal       Cardiovascular negative cardio ROS  Rhythm:Regular Rate:Normal     Neuro/Psych  Headaches (nadalol),    GI/Hepatic negative GI ROS, Neg liver ROS,   Endo/Other  negative endocrine ROS  Renal/GU negative Renal ROS     Musculoskeletal   Abdominal   Peds  Hematology negative hematology ROS (+)   Anesthesia Other Findings   Reproductive/Obstetrics                           Anesthesia Physical Anesthesia Plan  ASA: II  Anesthesia Plan: General   Post-op Pain Management:    Induction: Intravenous  Airway Management Planned: LMA  Additional Equipment:   Intra-op Plan:   Post-operative Plan:   Informed Consent: I have reviewed the patients History and Physical, chart, labs and discussed the procedure including the risks, benefits and alternatives for the proposed anesthesia with the patient or authorized representative who has indicated his/her understanding and acceptance.   Dental advisory given  Plan Discussed with: CRNA and Surgeon  Anesthesia Plan Comments: (Plan routine monitors, GA- LMA OK, femoral nerve block for post op analgesia)        Anesthesia Quick Evaluation

## 2012-11-30 NOTE — Progress Notes (Signed)
UR COMPLETED  

## 2012-11-30 NOTE — Interval H&P Note (Signed)
History and Physical Interval Note:  11/30/2012 7:30 AM  Erica Bernard  has presented today for surgery, with the diagnosis of OA R Knee  The various methods of treatment have been discussed with the patient and family. After consideration of risks, benefits and other options for treatment, the patient has consented to  Procedure(s): RIGHT TOTAL KNEE ARTHROPLASTY WITH MEIDAL AND LATERAL COMPARTMENT WITH PATELLA RESURFACING (Right) as a surgical intervention .  The patient's history has been reviewed, patient examined, no change in status, stable for surgery.  I have reviewed the patient's chart and labs.  Questions were answered to the patient's satisfaction.     Saher Davee JR,W D

## 2012-11-30 NOTE — Preoperative (Signed)
Beta Blockers   Reason not to administer Beta Blockers:Not Applicable 

## 2012-11-30 NOTE — Progress Notes (Signed)
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

## 2012-12-01 DIAGNOSIS — G43909 Migraine, unspecified, not intractable, without status migrainosus: Secondary | ICD-10-CM | POA: Insufficient documentation

## 2012-12-01 DIAGNOSIS — M170 Bilateral primary osteoarthritis of knee: Secondary | ICD-10-CM | POA: Insufficient documentation

## 2012-12-01 LAB — BASIC METABOLIC PANEL
BUN: 8 mg/dL (ref 6–23)
Calcium: 8.4 mg/dL (ref 8.4–10.5)
Creatinine, Ser: 0.64 mg/dL (ref 0.50–1.10)
GFR calc Af Amer: >90 mL/min (ref >90)
GFR calc non Af Amer: >90 mL/min (ref >90)
Glucose, Bld: 113 mg/dL — ABNORMAL HIGH (ref 70–99)
Potassium: 4 mEq/L (ref 3.5–5.1)

## 2012-12-01 LAB — CBC
HCT: 28.5 % — ABNORMAL LOW (ref 36.0–46.0)
Hemoglobin: 9.6 g/dL — ABNORMAL LOW (ref 12.0–15.0)
MCH: 30.5 pg (ref 26.0–34.0)
MCHC: 33.7 g/dL (ref 30.0–36.0)
MCV: 90.5 fL (ref 78.0–100.0)
RDW: 13.4 % (ref 11.5–15.5)

## 2012-12-01 NOTE — Op Note (Signed)
NAME:  ANACAREN, KOHAN NO.:  [id number]  MEDICAL RECORD NO.:  [id number]  LOCATION:  5N20C                        FACILITY:  [facility]  PHYSICIAN:  Dyke Brackett, M.D.    DATE OF BIRTH:  [birth year]  DATE OF PROCEDURE:  11/30/2012 DATE OF DISCHARGE:                              OPERATIVE REPORT   INDICATIONS:  A 62 year old with severe right knee pain, valgus arthrosis, thought to be amenable for hospitalization and knee replacement.  PREOPERATIVE DIAGNOSIS:  Osteoarthritis, right knee.  POSTOPERATIVE DIAGNOSIS:  Osteoarthritis, right knee.  OPERATION:  Right total knee replacement for a cemented knee size 2.5 femur, size 3 tibia, 38 mm all poly patella with 15 mm bearing.  SURGEON:  Dyke Brackett, M.D.  ASSISTANT:  Margart Sickles, PA-C.  TOURNIQUET TIME:  Approximately 55 minutes.  DESCRIPTION OF PROCEDURE:  Sterile prep and drape, exsanguination of leg, placed the tourniquet to 350.  Straight skin incision medial parapatellar approach was made.  We cut 11 mm off the distal femur, followed by a 40 degree valgus cut on the tibia about 3 mm below the most diseased lateral compartment.  Extension gap was measured initially at 15 and matched the flexion gap at 15 mm.  We then placed the tibial sizing guide, decided on a size 2.5.  Based on that as well as prior surgery the opposite leg size, after the spacer block was placed with the appropriate degree of external rotation, we did the anterior- posterior chamfer cuts on the femur.  We then directed our attention to the tibia.  Carolin [place] was cut for the tibia, size 3.  Trial tibia was placed, followed by the box cut on the femur.  Femoral trial was placed.  Full extension was possible.  Felt like the 15 mm was the best balance of the stability and range of motion.  Parameters:  Patella was cut leaving about 15 mm of native patella and the trial patella was placed as well. Again, full extension and good restoration of  the normal anatomic alignment as well as a good balancing was obtained.  Final components were cemented in tibia followed by femur patella. Cement was allowed to harden.  Trial bearing placed.  Trial bearing was removed.  No excess cement noted back in the knee.  Once the trial bearing was removed, tourniquet released.  No excessive bleeding was noted.  Wound was irrigated.  Hemovac drain was placed exiting superolaterally.  Closure was affected with #1 Ethibond, 0 and 2-0 Vicryl, skin clips.  Taken to recovery room in stable condition.     Dyke Brackett, M.D.     WDC/MEDQ  D:  11/30/2012  T:  12/01/2012  Job:  [id number]

## 2012-12-01 NOTE — Progress Notes (Signed)
Subjective: 1 Day Post-Op Procedure(s) (LRB): RIGHT TOTAL KNEE ARTHROPLASTY WITH MEIDAL AND LATERAL COMPARTMENT WITH PATELLA RESURFACING (Right) Patient reports pain as 6 on 0-10 scale.    Objective: Vital signs in last 24 hours: Temp:  [97.3 F (36.3 C)-98.7 F (37.1 C)] 98.7 F (37.1 C) (08/30 0537) Pulse Rate:  [41-74] 61 (08/30 0537) Resp:  [6-18] 16 (08/30 0537) BP: (106-152)/(58-76) 117/58 mmHg (08/30 0537) SpO2:  [97 %-100 %] 100 % (08/30 0537)  Intake/Output from previous day: 08/29 0701 - 08/30 0700 In: 3540 [P.O.:640; I.V.:2900] Out: 1900 [Urine:1300; Emesis/NG output:150; Drains:350; Blood:100] Intake/Output this shift:     Recent Labs  11/30/12 1432 12/01/12 0545  HGB 11.9* 9.6*    Recent Labs  11/30/12 1432 12/01/12 0545  WBC 15.2* 9.8  RBC 3.83* 3.15*  HCT 34.6* 28.5*  PLT 232 213    Recent Labs  11/30/12 1432 12/01/12 0545  NA  --  135  K  --  4.0  CL  --  100  CO2  --  24  BUN  --  8  CREATININE 0.60 0.64  GLUCOSE  --  113*  CALCIUM  --  8.4   No results found for this basename: LABPT, INR,  in the last 72 hours  ABD soft Neurovascular intact Sensation intact distally Intact pulses distally Dorsiflexion/Plantar flexion intact Incision: dressing C/D/I  Assessment/Plan: 1 Day Post-Op Procedure(s) (LRB): RIGHT TOTAL KNEE ARTHROPLASTY WITH MEIDAL AND LATERAL COMPARTMENT WITH PATELLA RESURFACING (Right) Advance diet Up with therapy Discharge to SNF Patient walked to the bathroom with me.  Progressing cautiously.  Plan on SNF on Monday.  [facility]  Johnston J 12/01/2012, 10:29 AM

## 2012-12-01 NOTE — Progress Notes (Signed)
OT Cancellation Note and Discharge  Patient Details Name: Erica Bernard MRN: [id number] DOB: 1950-09-15   Cancelled Treatment:    Reason Eval/Treat Not Completed: Other (comment) Pt is planning on going to SNF--will defer OT eval to that facility. Acute OT will sign off.  Evette Georges [phone] 12/01/2012, 7:41 AM

## 2012-12-01 NOTE — Progress Notes (Signed)
Physical Therapy Treatment Patient Details Name: Erica Bernard MRN: [id number] DOB: 1950/04/16 Today's Date: 12/01/2012 Time: 4098-1191 PT Time Calculation (min): 26 min  PT Assessment / Plan / Recommendation  History of Present Illness s/p Elective Rt tka    PT Comments   Pt slowly progressing with mobility. Able to amb to bathroom and returned to bed per pt request. Pt c/o pain during amb but at rest rates pain as 0/10. Pt very fatigued and attributes this to pain medicine. Will cont to f/u with pt to maximize functional mobility Until D/C to SNF.   Follow Up Recommendations  SNF;Supervision/Assistance - 24 hour     Does the patient have the potential to tolerate intense rehabilitation     Barriers to Discharge        Equipment Recommendations  Other (comment) (TBD at SNF)    Recommendations for Other Services    Frequency 7X/week   Progress towards PT Goals Progress towards PT goals: Progressing toward goals  Plan Current plan remains appropriate    Precautions / Restrictions Precautions Precautions: Fall;Knee Required Braces or Orthoses: Knee Immobilizer - Right Knee Immobilizer - Right: On when out of bed or walking Restrictions Weight Bearing Restrictions: Yes RLE Weight Bearing: Weight bearing as tolerated   Pertinent Vitals/Pain 0/10 at rest; c/o pain during walking but did not rate. Pt premedicated by RN; IV dilaudid.     Mobility  Bed Mobility Bed Mobility: Sit to Supine Sit to Supine: 4: Min assist;HOB flat;With rail Details for Bed Mobility Assistance: (A) to bring Rt LE onto bed; verbal cues for sequencing and hand placement; pt c/o increased pain and has difficulty advaning Rt LE   Transfers Transfers: Sit to Stand;Stand to Sit Sit to Stand: 4: Min assist;With armrests;From chair/3-in-1;From toilet Stand to Sit: 4: Min guard;To toilet;To chair/3-in-1 Details for Transfer Assistance: pt relies heavily on armrests and handicap railing for support with sit <>  stand transfers; cues for hand placement and safety with RW;  min guard to steady and (A) pt to seated position  Ambulation/Gait Ambulation/Gait Assistance: 4: Min guard Ambulation Distance (Feet): 10 Feet (x2) Assistive device: Rolling walker Ambulation/Gait Assistance Details: cues for gt sequencing and safety with RW; min guard to steady and for safety; pt with decr ability to WB through Rt LE; c/o pain; amb with step to gt and relies heavily on UEs Gait Pattern: Step-to pattern;Decreased stance time - right;Decreased step length - left Gait velocity: decr due to pain Stairs: No Wheelchair Mobility Wheelchair Mobility: No    Exercises Total Joint Exercises Ankle Circles/Pumps: AROM;Both;10 reps;Seated Quad Sets: AAROM;Right;10 reps;Seated Hip ABduction/ADduction: AAROM;Right;10 reps;Seated Long Arc Quad: AAROM;Right;10 reps;Seated Goniometric ROM: AROM Rt knee70 degrees in sitting   PT Diagnosis:    PT Problem List:   PT Treatment Interventions:     PT Goals (current goals can now be found in the care plan section) Acute Rehab PT Goals Patient Stated Goal: to get some sleep PT Goal Formulation: With patient Potential to Achieve Goals: Good  Visit Information  Last PT Received On: 12/01/12 Assistance Needed: +1 History of Present Illness: s/p Elective Rt tka     Subjective Data  Subjective: "im so tired from all the medicine. i really just want to get in bed but we can walk to the bathroom."  Patient Stated Goal: to get some sleep   Cognition  Cognition Arousal/Alertness: Awake/alert Behavior During Therapy: WFL for tasks assessed/performed Overall Cognitive Status: Within Functional Limits for tasks assessed  Balance  Balance Balance Assessed: Yes Static Standing Balance Static Standing - Balance Support: No upper extremity supported;During functional activity Static Standing - Level of Assistance: 5: Stand by assistance  End of Session PT - End of  Session Equipment Utilized During Treatment: Gait belt;Right knee immobilizer Activity Tolerance: Patient limited by pain;Patient limited by fatigue Patient left: in bed;with call bell/phone within reach Nurse Communication: Mobility status   GP     Donell Sievert, [place] [phone] 12/01/2012, 1:59 PM

## 2012-12-01 NOTE — Plan of Care (Signed)
Problem: Consults Goal: Diagnosis- Total Joint Replacement Outcome: Completed/Met Date Met:  12/01/12 Primary Total Knee  Problem: Phase I Progression Outcomes Goal: Dangle or out of bed evening of surgery Outcome: Not Met (add Reason) Patient had nausea and vomiting, deferred dangling/OOB. Goal: Initial discharge plan identified Outcome: Completed/Met Date Met:  12/01/12 Patient reports plan is to go to [facility] in [place] for short term rehab.  Lives alone.

## 2012-12-02 LAB — CBC
Hemoglobin: 8.6 g/dL — ABNORMAL LOW (ref 12.0–15.0)
MCH: 30.6 pg (ref 26.0–34.0)
MCHC: 33.7 g/dL (ref 30.0–36.0)
Platelets: 178 10*3/uL (ref 150–400)
RDW: 13.5 % (ref 11.5–15.5)

## 2012-12-02 NOTE — Progress Notes (Signed)
Orthopedic Tech Progress Note Patient Details:  Kiesha Ensey [place] 08-02-1950 [id number]  Patient ID: Arsema Tusing [place], female   DOB: 21-Nov-1950, 62 y.o.   MRN: [id number] Placed pt's rle in cpm @0 -65 degrees @ 1455  Selma Mink 12/02/2012, 2:55 PM

## 2012-12-02 NOTE — Progress Notes (Signed)
Clinical Social Work Department CLINICAL SOCIAL WORK PLACEMENT NOTE 12/02/2012  Patient:  Erica Bernard, Erica Bernard  Account Number:  [id number] Admit date:  11/30/2012  Clinical Social Worker:  Becky Sax, LCSW  Date/time:  12/02/2012 12:00 M  Clinical Social Work is seeking post-discharge placement for this patient at the following level of care:   SKILLED NURSING   (*CSW will update this form in Epic as items are completed)   12/02/2012  Patient/family provided with [facility] Department of Clinical Social Work's list of facilities offering this level of care within the geographic area requested by the patient (or if unable, by the patient's family).  12/02/2012  Patient/family informed of their freedom to choose among providers that offer the needed level of care, that participate in Medicare, Medicaid or managed care program needed by the patient, have an available bed and are willing to accept the patient.  12/02/2012  Patient/family informed of [facility]' ownership interest in [facility], as well as of the fact that they are under no obligation to receive care at this facility.  PASARR submitted to EDS on 12/02/2012 PASARR number received from EDS on 12/02/2012  FL2 transmitted to all facilities in geographic area requested by pt/family on  12/02/2012 FL2 transmitted to all facilities within larger geographic area on   Patient informed that his/her managed care company has contracts with or will negotiate with  certain facilities, including the following:     Patient/family informed of bed offers received:   Patient chooses bed at  Physician recommends and patient chooses bed at    Patient to be transferred to  on   Patient to be transferred to facility by   The following physician request were entered in Epic:   Additional Comments:

## 2012-12-02 NOTE — Progress Notes (Signed)
Orthopaedic Trauma Service (OTS)  Subjective: 2 Days Post-Op Procedure(s) (LRB): RIGHT TOTAL KNEE ARTHROPLASTY WITH MEIDAL AND LATERAL COMPARTMENT WITH PATELLA RESURFACING (Right) Patient reports pain as mild.   Pain much improved. No BM.  Objective: Current Vitals Blood pressure 102/53, pulse 78, temperature 100.2 F (37.9 C), temperature source Oral, resp. rate 16, SpO2 93.00%. Vital signs in last 24 hours: Temp:  [98 F (36.7 C)-100.2 F (37.9 C)] 100.2 F (37.9 C) (08/31 0601) Pulse Rate:  [58-78] 78 (08/31 0601) Resp:  [16] 16 (08/31 0601) BP: (100-116)/(53-61) 102/53 mmHg (08/31 0601) SpO2:  [93 %-98 %] 93 % (08/31 0601)  Intake/Output from previous day: 08/30 0701 - 08/31 0700 In: 1412.5 [P.O.:600; I.V.:812.5] Out: 4 [Urine:4]  LABS  Recent Labs  11/30/12 1432 12/01/12 0545 12/02/12 0550  HGB 11.9* 9.6* 8.6*    Recent Labs  12/01/12 0545 12/02/12 0550  WBC 9.8 8.4  RBC 3.15* 2.81*  HCT 28.5* 25.5*  PLT 213 178    Recent Labs  11/30/12 1432 12/01/12 0545  NA  --  135  K  --  4.0  CL  --  100  CO2  --  24  BUN  --  8  CREATININE 0.60 0.64  GLUCOSE  --  113*  CALCIUM  --  8.4   No results found for this basename: LABPT, INR,  in the last 72 hours  Physical Exam  RLE Dressing pristine, in CPM, mild swelling  Sens DPN, SPN, TN intact  Motor EHL, ext, flex, evers 5/5  DP 2+, PT 2+    Imaging No results found.  Assessment/Plan: 2 Days Post-Op Procedure(s) (LRB): RIGHT TOTAL KNEE ARTHROPLASTY WITH MEIDAL AND LATERAL COMPARTMENT WITH PATELLA RESURFACING (Right)  Acute Blood Loss Anemia--check H/H tomorrow Cont PT WBAT, ROM Lovenox Awaiting SNF placement at [facility]  Myrene Galas, MD [facility] [phone] (p)   12/02/2012, 10:01 AM

## 2012-12-02 NOTE — Progress Notes (Signed)
Clinical Social Work Department BRIEF PSYCHOSOCIAL ASSESSMENT 12/02/2012  Patient:  Erica Bernard, Erica Bernard     Account Number:  [id number]     Admit date:  11/30/2012  Clinical Social Worker:  Hattie Perch  Date/Time:  12/02/2012 12:00 M  Referred by:  Physician  Date Referred:  12/02/2012 Referred for  SNF Placement   Other Referral:   Interview type:  Patient Other interview type:    PSYCHOSOCIAL DATA Living Status:  ALONE Admitted from facility:   Level of care:   Primary support name:  Al Corpus Primary support relationship to patient:  CHILD, ADULT Degree of support available:   good    CURRENT CONCERNS Current Concerns  Post-Acute Placement   Other Concerns:    SOCIAL WORK ASSESSMENT / PLAN CSW met with patient. patient is alert and oriented X3. patient in need of snf placement. patient states that she would like to go to [facility] in [place].   Assessment/plan status:   Other assessment/ plan:   Information/referral to community resources:    PATIENT'S/FAMILY'S RESPONSE TO PLAN OF CARE: patient is hopeful to have a bed at [facility] in [place].

## 2012-12-02 NOTE — Progress Notes (Signed)
Physical Therapy Treatment Patient Details Name: Erica Bernard MRN: [id number] DOB: [birth year] Today's Date: 12/02/2012 Time: 0852-0919 PT Time Calculation (min): 27 min  PT Assessment / Plan / Recommendation  History of Present Illness s/p Elective Rt tka    PT Comments   Pt progressing well with mobility. Able to increase amb distance and theraex this session. Pt hopes to D/C to [facility] prior to returning home, where she lives alone.   Follow Up Recommendations  SNF;Supervision/Assistance - 24 hour     Does the patient have the potential to tolerate intense rehabilitation     Barriers to Discharge        Equipment Recommendations  Other (comment)    Recommendations for Other Services    Frequency 7X/week   Progress towards PT Goals Progress towards PT goals: Progressing toward goals  Plan Current plan remains appropriate    Precautions / Restrictions Precautions Precautions: Fall;Knee Required Braces or Orthoses: Knee Immobilizer - Right Knee Immobilizer - Right: On when out of bed or walking Restrictions Weight Bearing Restrictions: Yes RLE Weight Bearing: Weight bearing as tolerated   Pertinent Vitals/Pain 0/10; RN provided medication to assist with pain control prior to session     Mobility  Bed Mobility Bed Mobility: Supine to Sit Sit to Supine: 4: Min assist;HOB flat Details for Bed Mobility Assistance: pt educated on using bil UEs to (A) Rt LE onto bed; pt was able to initiate but unable to fully complete advancement of Rt LE onto bed; requiring (A) of Rt LE to return to supine position; cues for sequencing and hand placement Transfers Transfers: Sit to Stand;Stand to Sit Sit to Stand: 4: Min guard;From chair/3-in-1;With armrests Stand to Sit: 5: Supervision;With armrests;To chair/3-in-1;To bed Details for Transfer Assistance: pt relies heavily on armrests for transfers; cues for safety and hand placement; min guard for sit to stand transfer  due to decr ability to WB through Rt LE and pain  Ambulation/Gait Ambulation/Gait Assistance: 5: Supervision Ambulation Distance (Feet): 200 Feet Assistive device: Rolling walker Ambulation/Gait Assistance Details: cues for gt sequencing and upright posture; pt continues to amb with step to gt; relies heaviliy on UEs due to pain with WB Gait Pattern: Step-to pattern;Decreased stance time - right;Decreased step length - left Gait velocity: decr due to pain Stairs: No Wheelchair Mobility Wheelchair Mobility: No    Exercises Total Joint Exercises Ankle Circles/Pumps: AROM;Both;10 reps;Seated Quad Sets: AAROM;Right;10 reps;Seated Heel Slides: AAROM;Right;10 reps;Seated;Other (comment) (c/o pain) Hip ABduction/ADduction: AAROM;Right;10 reps;Seated Long Arc Quad: AAROM;Right;10 reps;Seated Goniometric ROM: PROM Rt knee flex to 80 degrees limited by pain   PT Diagnosis:    PT Problem List:   PT Treatment Interventions:     PT Goals (current goals can now be found in the care plan section) Acute Rehab PT Goals Patient Stated Goal: to get in bed and get in machine PT Goal Formulation: With patient Potential to Achieve Goals: Good  Visit Information  Last PT Received On: 12/02/12 Assistance Needed: +1 History of Present Illness: s/p Elective Rt tka     Subjective Data  Subjective: "i knew you would come in here now. Id like to walk and get back in that machine"  Patient Stated Goal: to get in bed and get in machine   Cognition  Cognition Arousal/Alertness: Awake/alert Behavior During Therapy: WFL for tasks assessed/performed Overall Cognitive Status: Within Functional Limits for tasks assessed    Balance  Balance Balance Assessed: Yes Static Standing Balance Static Standing - Balance Support:  No upper extremity supported;During functional activity Static Standing - Level of Assistance: 5: Stand by assistance  End of Session PT - End of Session Equipment Utilized During  Treatment: Gait belt;Right knee immobilizer Activity Tolerance: Patient tolerated treatment well Patient left: in bed;in CPM;with call bell/phone within reach;Other (comment) (CPM set to 65 degrees) Nurse Communication: Mobility status   GP     Donell Sievert, [place] [phone] 12/02/2012, 10:15 AM

## 2012-12-03 LAB — CBC
MCV: 90.9 fL (ref 78.0–100.0)
Platelets: 180 10*3/uL (ref 150–400)
RBC: 2.76 MIL/uL — ABNORMAL LOW (ref 3.87–5.11)
RDW: 13.8 % (ref 11.5–15.5)
WBC: 7.6 10*3/uL (ref 4.0–10.5)

## 2012-12-03 NOTE — Progress Notes (Signed)
Physical Therapy Treatment Patient Details Name: Erica Bernard MRN: [id number] DOB: Dec 11, 1950 Today's Date: 12/03/2012 Time: 4098-1191 PT Time Calculation (min): 24 min  PT Assessment / Plan / Recommendation  History of Present Illness s/p Elective Rt tka    PT Comments   Pt making steady progress with mobility. Anticipate D/C when bed is availible at [facility]. Pt to benefit from ST SNF due to decr caregiver support at home and pt has steps at her house. Will cont to f/u with pt to maximize functional mobility.   Follow Up Recommendations  SNF;Supervision/Assistance - 24 hour     Does the patient have the potential to tolerate intense rehabilitation     Barriers to Discharge        Equipment Recommendations   (TBD at SNF)    Recommendations for Other Services    Frequency 7X/week   Progress towards PT Goals Progress towards PT goals: Progressing toward goals  Plan Current plan remains appropriate    Precautions / Restrictions Precautions Precautions: Fall;Knee Required Braces or Orthoses: Knee Immobilizer - Right Knee Immobilizer - Right: On when out of bed or walking Restrictions Weight Bearing Restrictions: Yes RLE Weight Bearing: Weight bearing as tolerated   Pertinent Vitals/Pain 2/10; pt reports she was premedicated     Mobility  Bed Mobility Bed Mobility: Supine to Sit;Sitting - Scoot to Edge of Bed Supine to Sit: 5: Supervision;HOB flat;With rails Sitting - Scoot to Edge of Bed: 5: Supervision Details for Bed Mobility Assistance: bed flattened to simulate home envrioment; pt relied heavily on handrail; was able to come to long sitting by pulling on mattress and (A) Rt LE with UEs off EOB; increased time due to pain required  Transfers Transfers: Sit to Stand;Stand to Sit Sit to Stand: 5: Supervision;From bed Stand to Sit: 4: Min guard;With armrests;To chair/3-in-1 Details for Transfer Assistance: cues for hand placement and safety; pt tends to flop into  chair; encouraged pt to control descent to chair with UEs and Lt LE Ambulation/Gait Ambulation/Gait Assistance: 5: Supervision Ambulation Distance (Feet): 300 Feet Assistive device: Rolling walker Ambulation/Gait Assistance Details: cues for gt sequencing; pt progressing to step through gt more consistently; fatigues quickly; requring single standing rest break  Gait Pattern: Step-through pattern;Decreased stride length Gait velocity: decr due to pain Stairs: No Wheelchair Mobility Wheelchair Mobility: No    Exercises Total Joint Exercises Ankle Circles/Pumps: Both;10 reps;Supine Short Arc Quad: AAROM;Right;10 reps;Supine;Other (comment) (demo difficulty completing full ROM; requires (A)) Heel Slides: AAROM;Right;10 reps;Supine (c/o pain) Hip ABduction/ADduction: AAROM;Right;10 reps;Supine Straight Leg Raises: Other (comment) (unable to complete independently )   PT Diagnosis:    PT Problem List:   PT Treatment Interventions:     PT Goals (current goals can now be found in the care plan section) Acute Rehab PT Goals Patient Stated Goal: to find out about when im leaving  PT Goal Formulation: With patient Potential to Achieve Goals: Good  Visit Information  Last PT Received On: 12/03/12 Assistance Needed: +1 History of Present Illness: s/p Elective Rt tka     Subjective Data  Subjective: "the hours just drag by here." pt agreeable to therapy; very pleasant and eager to work  Patient Stated Goal: to find out about when im leaving    Cognition  Cognition Arousal/Alertness: Awake/alert Behavior During Therapy: WFL for tasks assessed/performed Overall Cognitive Status: Within Functional Limits for tasks assessed    Balance  Balance Balance Assessed: No  End of Session PT - End of Session Equipment  Utilized During Treatment: Right knee immobilizer Activity Tolerance: Patient tolerated treatment well Patient left: in chair;with call bell/phone within reach;Other (comment)  (in footsie roll) Nurse Communication: Mobility status   GP     Donell Sievert, [place] [phone] 12/03/2012, 8:35 AM

## 2012-12-03 NOTE — Progress Notes (Signed)
[facility] Progress Note     3 Days Post-Op  Subjective   Feeling better + flatus No BM- did not have BM after L TKA until she got home  In CPM right now No new issues   Review of Systems  Constitutional: Negative for fever and chills.       No lightheadedness   Eyes: Negative for blurred vision.  Respiratory: Negative for shortness of breath.   Cardiovascular: Negative for chest pain and palpitations.  Gastrointestinal: Negative for nausea, vomiting and abdominal pain.  Genitourinary: Negative for dysuria.  Neurological: Negative for tingling, weakness and headaches.     Objective  BP 112/70  Pulse 61  Temp(Src) 98.4 F (36.9 C) (Oral)  Resp 16  SpO2 95%  Patient Vitals for the past 24 hrs:  BP Temp Temp src Pulse Resp SpO2  12/03/12 0627 112/70 mmHg 98.4 F (36.9 C) Oral 61 16 95 %  12/02/12 2322 91/47 mmHg 98.8 F (37.1 C) Oral 65 16 98 %  12/02/12 1600 - - - - 16 -  12/02/12 1200 - - - - 16 -    Intake/Output     08/31 0701 - 09/01 0700 09/01 0701 - 09/02 0700   P.O. 240    I.V.     Total Intake 240     Urine 1    Total Output 1     Net +239            Labs Results for Erica, Bernard (MRN [id number]) as of 12/03/2012 10:08  Ref. Range 12/03/2012 06:15  WBC Latest Range: 4.0-10.5 K/uL 7.6  RBC Latest Range: 3.87-5.11 MIL/uL 2.76 (L)  Hemoglobin Latest Range: 12.0-15.0 g/dL 8.4 (L)  HCT Latest Range: 36.0-46.0 % 25.1 (L)  MCV Latest Range: 78.0-100.0 fL 90.9  MCH Latest Range: 26.0-34.0 pg 30.4  MCHC Latest Range: 30.0-36.0 g/dL 40.9  RDW Latest Range: 11.5-15.5 % 13.8  Platelets Latest Range: 150-400 K/uL 180    Exam  Gen: lying in bed in CPM, NAD, appears comfortable Lungs: clear B  Cardiac: reg, S1 and S2 Abd:  + BS, NTND Ext:            Right Lower Extremity    TED hose in place  Incision looks great  No signs of infection   No drainage   Distal motor and sensory functions intact  Ext warm  + DP pulse  No  DCT  Compartments soft and NT     Assessment and Plan  3 Days Post-Op  62 y/o female with R knee DJD s/p R TKA POD 3  1. 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 ABL anemia:  Stable  asymptomatic  Check labs tomorrow   3. Pain management:  Continue with current regimen   4. DVT/PE prophylaxis:  lovenox  5. Activity:  OOB as tolerated  WBAT R leg  6. FEN/Foley/Lines:  Reg diet  Advance bowel regimen   7. Dispo:  Stable for snf   Will go tomorrow due to holiday   Mearl Latin, PA-C [facility] [phone] (P) 12/03/2012 10:07 AM

## 2012-12-04 ENCOUNTER — Encounter (HOSPITAL_COMMUNITY): Payer: Self-pay | Admitting: Orthopedic Surgery

## 2012-12-04 MED ORDER — ENOXAPARIN SODIUM 30 MG/0.3ML ~~LOC~~ SOLN: 30.0000 mg | Freq: Two times a day (BID) | SUBCUTANEOUS | Status: DC

## 2012-12-04 MED ORDER — OXYCODONE HCL 5 MG PO TABS: ORAL_TABLET | ORAL | Status: DC

## 2012-12-04 NOTE — Discharge Summary (Signed)
PATIENT ID: Erica Bernard        MRN:  [id number]          DOB/AGE: 04/23/1950 / 62 y.o.    DISCHARGE SUMMARY  ADMISSION DATE:    11/30/2012 DISCHARGE DATE:   12/04/2012   ADMISSION DIAGNOSIS: OA R Knee    DISCHARGE DIAGNOSIS:  Osteoarthritis Right Knee    ADDITIONAL DIAGNOSIS: Active Problems:   * No active hospital problems. *  Past Medical History  Diagnosis Date  . Migraines     Takes Nadolol for Migraines  . Osteoarthritis of both knees     PROCEDURE: Procedure(s): RIGHT TOTAL KNEE ARTHROPLASTY WITH MEIDAL AND LATERAL COMPARTMENT WITH PATELLA RESURFACING Right on 11/30/2012  CONSULTS: none     HISTORY:  See H&P in chart  HOSPITAL COURSE:  Ikeya Brockel Orne is a 62 y.o. admitted on 11/30/2012 and found to have a diagnosis of Osteoarthritis Right Knee.  After appropriate laboratory studies were obtained  they were taken to the operating room on 11/30/2012 and underwent  Procedure(s): RIGHT TOTAL KNEE ARTHROPLASTY WITH MEIDAL AND LATERAL COMPARTMENT WITH PATELLA RESURFACING  Right.   They were given perioperative antibiotics:  Anti-infectives   Start     Dose/Rate Route Frequency Ordered Stop   11/30/12 1400  ceFAZolin (ANCEF) IVPB 1 g/50 mL premix     1 g 100 mL/hr over 30 Minutes Intravenous Every 6 hours 11/30/12 1350 11/30/12 2041   11/30/12 0600  ceFAZolin (ANCEF) IVPB 2 g/50 mL premix     2 g 100 mL/hr over 30 Minutes Intravenous On call to O.R. 11/29/12 1451 11/30/12 0804    .  Tolerated the procedure well.  Placed with a foley intraoperatively.      POD #1, allowed out of bed to a chair.  PT for ambulation and exercise program.  Foley D/C'd in morning.  IV saline locked.  O2 discontionued.  POD #2, continued PT and ambulation.   Hemovac pulled. ABL anemia but stable.  POD#3 continued PT and ambulation, unable to d/c to SNF due to holiday.  The remainder of the hospital course was dedicated to ambulation and strengthening.   The patient was discharged on 4  Days Post-Op in  Stable condition.  Blood products given:none  DIAGNOSTIC STUDIES: Recent vital signs: Patient Vitals for the past 24 hrs:  BP Temp Temp src Pulse Resp SpO2  12/04/12 0506 122/59 mmHg 98 F (36.7 C) Oral 65 18 99 %  12/03/12 1953 121/76 mmHg 98.9 F (37.2 C) Oral 69 18 98 %  12/03/12 1400 109/65 mmHg 99 F (37.2 C) Oral 62 18 98 %       Recent laboratory studies:  Recent Labs  11/30/12 1432 12/01/12 0545 12/02/12 0550 12/03/12 0615  WBC 15.2* 9.8 8.4 7.6  HGB 11.9* 9.6* 8.6* 8.4*  HCT 34.6* 28.5* 25.5* 25.1*  PLT 232 213 178 180    Recent Labs  11/30/12 1432 12/01/12 0545  NA  --  135  K  --  4.0  CL  --  100  CO2  --  24  BUN  --  8  CREATININE 0.60 0.64  GLUCOSE  --  113*  CALCIUM  --  8.4   Lab Results  Component Value Date   INR 0.97 11/23/2012   INR 0.95 01/11/2010     Recent Radiographic Studies :  Dg Chest 2 View  11/23/2012   *RADIOLOGY REPORT*  Clinical Data: Preop for right knee osteoarthritis  CHEST -  2 VIEW  Comparison: 01/11/2010  Findings: Cardiomediastinal silhouette is stable.  Mild thoracic dextroscoliosis.  Stable mild lumbar levoscoliosis.  No acute infiltrate or pleural effusion.  No pulmonary edema.  Mild degenerative changes mid thoracic spine.  IMPRESSION: No active disease.  Mild thoracolumbar scoliosis.   Original Report Authenticated By: Natasha Mead, M.D.    DISCHARGE INSTRUCTIONS:   DISCHARGE MEDICATIONS:     Medication List         atorvastatin 10 MG tablet  Commonly known as:  LIPITOR  Take 10 mg by mouth every other day.     butalbital-acetaminophen-caffeine 50-325-40 MG per tablet  Commonly known as:  FIORICET, ESGIC  Take 1 tablet by mouth once as needed for headache. Uses if Naproxen is ineffective     enoxaparin 30 MG/0.3ML injection  Commonly known as:  LOVENOX  Inject 0.3 mLs (30 mg total) into the skin every 12 (twelve) hours.     nadolol 40 MG tablet  Commonly known as:  CORGARD  Take 20 mg by  mouth daily. Takes For Migraines NOT for heart or BP     naproxen 500 MG tablet  Commonly known as:  NAPROSYN  Take 500 mg by mouth once as needed. For migraine     oxyCODONE 5 MG immediate release tablet  Commonly known as:  Oxy IR/ROXICODONE  1-2 tabs po q4-6hrs prn pain        FOLLOW UP VISIT:       Follow-up Information   Follow up with CAFFREY JR,W D, MD. Schedule an appointment as soon as possible for a visit in 2 weeks.   Specialty:  Orthopedic Surgery   Contact information:   [address] [place] [address] [phone]       DISPOSITION:   Skilled Nursing Facility/Rehab  CONDITION:  Stable   Margart Sickles 12/04/2012, 8:22 AM

## 2012-12-04 NOTE — Progress Notes (Signed)
Patient has been discharged to [facility] in [place] for further rehabilitation. She is being transported by her family and has no further concerns at this time.

## 2012-12-04 NOTE — Progress Notes (Signed)
Physical Therapy Treatment Patient Details Name: Erica Bernard MRN: [id number] DOB: [birth year] Today's Date: 12/04/2012 Time: 1191-4782 PT Time Calculation (min): 16 min  PT Assessment / Plan / Recommendation  History of Present Illness s/p Elective Rt tka    PT Comments   Patient a little more sore this morning so did not ambulate as far as she yesterday. Patient is planning to DC to [facility] later today. Continue with current POC  Follow Up Recommendations  SNF;Supervision/Assistance - 24 hour     Does the patient have the potential to tolerate intense rehabilitation     Barriers to Discharge        Equipment Recommendations       Recommendations for Other Services    Frequency 7X/week   Progress towards PT Goals Progress towards PT goals: Progressing toward goals  Plan Current plan remains appropriate    Precautions / Restrictions Precautions Precautions: Fall;Knee Required Braces or Orthoses: Knee Immobilizer - Right Knee Immobilizer - Right: On when out of bed or walking Restrictions Weight Bearing Restrictions: Yes RLE Weight Bearing: Weight bearing as tolerated   Pertinent Vitals/Pain 6/10 knee pain. patient repositioned for comfort     Mobility  Bed Mobility Bed Mobility: Not assessed Transfers Sit to Stand: 5: Supervision;From chair/3-in-1 Stand to Sit: To chair/3-in-1;5: Supervision Details for Transfer Assistance: cues for hand placement and safety; pt tends to flop into chair; encouraged pt to control descent to chair with UEs and Lt LE Ambulation/Gait Ambulation/Gait Assistance: 5: Supervision Ambulation Distance (Feet): 200 Feet Assistive device: Rolling walker Ambulation/Gait Assistance Details: Patient a little more sore this AM so did not ambulate as far. Ambulated without KI with no evidence of buckling Gait Pattern: Step-through pattern;Decreased stride length Gait velocity: decr due to pain    Exercises Total Joint Exercises Quad Sets:  AAROM;Right;10 reps;Seated Heel Slides: AAROM;Right;10 reps Hip ABduction/ADduction: AAROM;Right;10 reps Straight Leg Raises: AAROM;Right;10 reps   PT Diagnosis:    PT Problem List:   PT Treatment Interventions:     PT Goals (current goals can now be found in the care plan section)    Visit Information  Last PT Received On: 12/04/12 Assistance Needed: +1 History of Present Illness: s/p Elective Rt tka     Subjective Data      Cognition  Cognition Arousal/Alertness: Awake/alert Behavior During Therapy: WFL for tasks assessed/performed Overall Cognitive Status: Within Functional Limits for tasks assessed    Balance     End of Session PT - End of Session Equipment Utilized During Treatment: Right knee immobilizer Activity Tolerance: Patient tolerated treatment well Patient left: in chair;with call bell/phone within reach;Other (comment) Nurse Communication: Mobility status CPM Right Knee CPM Right Knee: Off   GP     Fredrich Birks 12/04/2012, 11:43 AM 12/04/2012 Fredrich Birks PTA [phone] pager [phone] office

## 2013-03-01 ENCOUNTER — Ambulatory Visit: Payer: Self-pay | Admitting: Internal Medicine

## 2013-03-26 ENCOUNTER — Ambulatory Visit: Payer: Self-pay | Admitting: Internal Medicine

## 2014-04-02 ENCOUNTER — Ambulatory Visit: Payer: Self-pay | Admitting: Internal Medicine

## 2014-04-14 ENCOUNTER — Ambulatory Visit: Payer: Self-pay | Admitting: Internal Medicine

## 2014-08-07 ENCOUNTER — Encounter: Payer: Self-pay | Admitting: *Deleted

## 2014-08-07 DIAGNOSIS — E780 Pure hypercholesterolemia: Secondary | ICD-10-CM | POA: Diagnosis not present

## 2014-08-07 DIAGNOSIS — Z90710 Acquired absence of both cervix and uterus: Secondary | ICD-10-CM | POA: Diagnosis not present

## 2014-08-07 DIAGNOSIS — Z882 Allergy status to sulfonamides status: Secondary | ICD-10-CM | POA: Diagnosis not present

## 2014-08-07 DIAGNOSIS — G43909 Migraine, unspecified, not intractable, without status migrainosus: Secondary | ICD-10-CM | POA: Diagnosis not present

## 2014-08-07 DIAGNOSIS — H2511 Age-related nuclear cataract, right eye: Secondary | ICD-10-CM | POA: Diagnosis not present

## 2014-08-07 DIAGNOSIS — Z96653 Presence of artificial knee joint, bilateral: Secondary | ICD-10-CM | POA: Diagnosis not present

## 2014-08-07 DIAGNOSIS — M810 Age-related osteoporosis without current pathological fracture: Secondary | ICD-10-CM | POA: Diagnosis not present

## 2014-08-07 DIAGNOSIS — Z79899 Other long term (current) drug therapy: Secondary | ICD-10-CM | POA: Diagnosis not present

## 2014-08-10 NOTE — H&P (Signed)
  History and physical was faxed and scanned in.   

## 2014-08-11 ENCOUNTER — Ambulatory Visit: Payer: BLUE CROSS/BLUE SHIELD | Admitting: Anesthesiology

## 2014-08-11 ENCOUNTER — Ambulatory Visit
Admission: RE | Admit: 2014-08-11 | Discharge: 2014-08-11 | Disposition: A | Discharge status: Home / Self Care | Payer: BLUE CROSS/BLUE SHIELD | Source: Ambulatory Visit | Attending: Ophthalmology | Admitting: Ophthalmology

## 2014-08-11 ENCOUNTER — Encounter
Admission: RE | Disposition: A | Discharge status: Home / Self Care | Payer: Self-pay | Source: Ambulatory Visit | Attending: Ophthalmology

## 2014-08-11 DIAGNOSIS — Z90710 Acquired absence of both cervix and uterus: Secondary | ICD-10-CM | POA: Insufficient documentation

## 2014-08-11 DIAGNOSIS — Z96653 Presence of artificial knee joint, bilateral: Secondary | ICD-10-CM | POA: Insufficient documentation

## 2014-08-11 DIAGNOSIS — Z882 Allergy status to sulfonamides status: Secondary | ICD-10-CM | POA: Insufficient documentation

## 2014-08-11 DIAGNOSIS — G43909 Migraine, unspecified, not intractable, without status migrainosus: Secondary | ICD-10-CM | POA: Insufficient documentation

## 2014-08-11 DIAGNOSIS — Z79899 Other long term (current) drug therapy: Secondary | ICD-10-CM | POA: Insufficient documentation

## 2014-08-11 DIAGNOSIS — M810 Age-related osteoporosis without current pathological fracture: Secondary | ICD-10-CM | POA: Insufficient documentation

## 2014-08-11 DIAGNOSIS — H2511 Age-related nuclear cataract, right eye: Secondary | ICD-10-CM | POA: Diagnosis not present

## 2014-08-11 DIAGNOSIS — E780 Pure hypercholesterolemia: Secondary | ICD-10-CM | POA: Insufficient documentation

## 2014-08-11 HISTORY — PX: CATARACT EXTRACTION W/PHACO: SHX586

## 2014-08-11 SURGERY — PHACOEMULSIFICATION, CATARACT, WITH IOL INSERTION
Anesthesia: Monitor Anesthesia Care | Site: Eye | Laterality: Right | Wound class: Clean

## 2014-08-11 MED ORDER — SODIUM CHLORIDE 0.9 % IV SOLN
INTRAVENOUS | Status: DC
  Administered 2014-08-11: 11:00:00 via INTRAVENOUS

## 2014-08-11 MED ORDER — BSS IO SOLN
INTRAOCULAR | Status: DC | PRN
  Administered 2014-08-11: 1 mL

## 2014-08-11 MED ORDER — CEFUROXIME OPHTHALMIC INJECTION 1 MG/0.1 ML
INJECTION | OPHTHALMIC | Status: AC
  Filled 2014-08-11: qty 0.1

## 2014-08-11 MED ORDER — HYALURONIDASE HUMAN 150 UNIT/ML IJ SOLN
INTRAMUSCULAR | Status: AC
  Filled 2014-08-11: qty 1

## 2014-08-11 MED ORDER — EPINEPHRINE HCL 1 MG/ML IJ SOLN
INTRAMUSCULAR | Status: AC
  Filled 2014-08-11: qty 1

## 2014-08-11 MED ORDER — MIDAZOLAM HCL 2 MG/2ML IJ SOLN
INTRAMUSCULAR | Status: DC | PRN
  Administered 2014-08-11: 0.5 mg via INTRAVENOUS

## 2014-08-11 MED ORDER — MOXIFLOXACIN HCL 0.5 % OP SOLN
OPHTHALMIC | Status: AC
  Administered 2014-08-11: 11:00:00 1 [drp] via OPHTHALMIC
  Filled 2014-08-11: qty 3

## 2014-08-11 MED ORDER — BUPIVACAINE HCL (PF) 0.75 % IJ SOLN
INTRAMUSCULAR | Status: AC
  Filled 2014-08-11: qty 10

## 2014-08-11 MED ORDER — PHENYLEPHRINE HCL 10 % OP SOLN
OPHTHALMIC | Status: AC
Start: 2014-08-11 — End: 2014-08-11
  Administered 2014-08-11: 11:00:00 1 [drp] via OPHTHALMIC
  Filled 2014-08-11: qty 5

## 2014-08-11 MED ORDER — NA CHONDROIT SULF-NA HYALURON 40-17 MG/ML IO SOLN
INTRAOCULAR | Status: AC
  Filled 2014-08-11: qty 1

## 2014-08-11 MED ORDER — MOXIFLOXACIN HCL 0.5 % OP SOLN - NO CHARGE
OPHTHALMIC | Status: DC | PRN
  Administered 2014-08-11: 1 [drp] via OPHTHALMIC

## 2014-08-11 MED ORDER — CARBACHOL 0.01 % IO SOLN
INTRAOCULAR | Status: DC | PRN
  Administered 2014-08-11: 0.5 mL via INTRAOCULAR

## 2014-08-11 MED ORDER — LIDOCAINE HCL (PF) 4 % IJ SOLN
INTRAMUSCULAR | Status: AC
  Filled 2014-08-11: qty 5

## 2014-08-11 MED ORDER — CYCLOPENTOLATE HCL 2 % OP SOLN
1.0000 [drp] | OPHTHALMIC | Status: AC
  Administered 2014-08-11 (×4): 1 [drp] via OPHTHALMIC

## 2014-08-11 MED ORDER — ALFENTANIL 500 MCG/ML IJ INJ
INJECTION | INTRAMUSCULAR | Status: DC | PRN
  Administered 2014-08-11: 500 ug via INTRAVENOUS

## 2014-08-11 MED ORDER — TETRACAINE HCL 0.5 % OP SOLN
OPHTHALMIC | Status: DC | PRN
  Administered 2014-08-11: 1 [drp] via OPHTHALMIC

## 2014-08-11 MED ORDER — PHENYLEPHRINE HCL 10 % OP SOLN
1.0000 [drp] | OPHTHALMIC | Status: AC
  Administered 2014-08-11 (×4): 1 [drp] via OPHTHALMIC

## 2014-08-11 MED ORDER — CYCLOPENTOLATE HCL 2 % OP SOLN: 1.0000 [drp] | OPHTHALMIC | Status: DC

## 2014-08-11 MED ORDER — TETRACAINE HCL 0.5 % OP SOLN
OPHTHALMIC | Status: AC
  Filled 2014-08-11: qty 2

## 2014-08-11 MED ORDER — CYCLOPENTOLATE HCL 2 % OP SOLN
OPHTHALMIC | Status: AC
  Administered 2014-08-11: 11:00:00 1 [drp] via OPHTHALMIC
  Filled 2014-08-11: qty 2

## 2014-08-11 MED ORDER — NA CHONDROIT SULF-NA HYALURON 40-17 MG/ML IO SOLN
INTRAOCULAR | Status: DC | PRN
  Administered 2014-08-11: 1 mL via INTRAOCULAR

## 2014-08-11 MED ORDER — MOXIFLOXACIN HCL 0.5 % OP SOLN
1.0000 [drp] | OPHTHALMIC | Status: AC
  Administered 2014-08-11 (×3): 1 [drp] via OPHTHALMIC

## 2014-08-11 SURGICAL SUPPLY — 27 items
CENTRION VISION SYSTEM ×2 IMPLANT
CORD BIP STRL DISP 12FT (MISCELLANEOUS) ×3 IMPLANT
DRAPE XRAY CASSETTE 23X24 (DRAPES) ×3 IMPLANT
ERASER HMR WETFIELD 18G (MISCELLANEOUS) ×3 IMPLANT
GLOVE BIO SURGEON STRL SZ8 (GLOVE) ×3 IMPLANT
GLOVE SURG LX 6.5 MICRO (GLOVE) ×2
GLOVE SURG LX 8.0 MICRO (GLOVE) ×2
GLOVE SURG LX STRL 6.5 MICRO (GLOVE) ×1 IMPLANT
GLOVE SURG LX STRL 8.0 MICRO (GLOVE) ×1 IMPLANT
GOWN STRL REUS W/ TWL LRG LVL3 (GOWN DISPOSABLE) ×1 IMPLANT
GOWN STRL REUS W/ TWL XL LVL3 (GOWN DISPOSABLE) ×1 IMPLANT
GOWN STRL REUS W/TWL LRG LVL3 (GOWN DISPOSABLE) ×3
GOWN STRL REUS W/TWL XL LVL3 (GOWN DISPOSABLE) ×3
LENS IOL ACRSF IQ ULTRA 25.0 (Intraocular Lens) IMPLANT
LENS IOL ACRYSERT 25.0 (Intraocular Lens) ×2 IMPLANT
LENS IOL ACRYSOF IQ 25.0 (Intraocular Lens) ×3 IMPLANT
PACK CATARACT (MISCELLANEOUS) ×3 IMPLANT
PACK CATARACT DINGLEDEIN LX (MISCELLANEOUS) ×3 IMPLANT
PACK EYE AFTER SURG (MISCELLANEOUS) ×3 IMPLANT
SHLD EYE VISITEC  UNIV (MISCELLANEOUS) ×3 IMPLANT
SOL PREP PVP 2OZ (MISCELLANEOUS) ×3
SOLUTION PREP PVP 2OZ (MISCELLANEOUS) ×1 IMPLANT
SUT SILK 5-0 (SUTURE) ×3 IMPLANT
SYR 5ML LL (SYRINGE) ×3 IMPLANT
SYR TB 1ML 27GX1/2 LL (SYRINGE) ×3 IMPLANT
WATER STERILE IRR 1000ML POUR (IV SOLUTION) ×3 IMPLANT
WIPE NON LINTING 3.25X3.25 (MISCELLANEOUS) ×3 IMPLANT

## 2014-08-11 NOTE — Discharge Instructions (Addendum)
See handout.  Follow up appt as previously scheduled.  Eye Surgery Discharge Instructions  Expect mild scratchy sensation or mild soreness. DO NOT RUB YOUR EYE!  The day of surgery:  Minimal physical activity, but bed rest is not required  No reading, computer work, or close hand work  No bending, lifting, or straining.  Rochel watch TV  For 24 hours:  No driving, legal decisions, or alcoholic beverages  Safety precautions  Eat anything you prefer: It is better to start with liquids, then soup then solid foods.  _____ Eye patch should be worn until postoperative exam tomorrow.  ____ Solar shield eyeglasses should be worn for comfort in the sunlight/patch while sleeping  Resume all regular medications including aspirin or Coumadin if these were discontinued prior to surgery. You Charters shower, bathe, shave, or wash your hair. Tylenol Heidt be taken for mild discomfort.  Call your doctor if you experience significant pain, nausea, or vomiting, fever > 101 or other signs of infection. [phone] or [phone] Specific instructions:

## 2014-08-11 NOTE — Interval H&P Note (Signed)
History and Physical Interval Note:  08/11/2014 12:37 PM  Erica SailsCynthia F Bernard  has presented today for surgery, with the diagnosis of CATARACT  The various methods of treatment have been discussed with the patient and family. After consideration of risks, benefits and other options for treatment, the patient has consented to  Procedure(s): CATARACT EXTRACTION PHACO AND INTRAOCULAR LENS PLACEMENT (IOC) (Right) as a surgical intervention .  The patient's history has been reviewed, patient examined, no change in status, stable for surgery.  I have reviewed the patient's chart and labs.  Questions were answered to the patient's satisfaction.     [name]

## 2014-08-11 NOTE — Op Note (Signed)
Date of Surgery: 08/11/2014 Date of Dictation: 08/11/2014 Pre-operative Diagnosis:Nuclear Sclerotic Cataract, Posterior Subcapsular Cataract, Cortical Cataract and Mature Cataract right Eye Post-operative Diagnosis: same Procedure performed: Extra-capsular Cataract Extraction (ECCE) with placement of a posterior chamber intraocular lens (IOL) right Eye IOL: SN6CWS 25.0D lens Anesthesia: 2% Lidocaine and 4% Marcaine in a 50/50 mixture with 10 unites/ml of Hylenex given as a peribulbar Anesthesiologist: Dr. Maisie Fushomas Complications: none Estimated Blood Loss: less than 1 ml  Description of procedure:  The patient was given anesthesia and sedation via intravenous access. The patient was then prepped and draped in the usual fashion. A 25-gauge needle was bent for initiating the capsulorhexis. A 5-0 silk suture was placed through the conjunctiva superior and inferiorly to serve as bridle sutures. Hemostasis was obtained at the superior limbus using an eraser cautery. A partial thickness groove was made at the anterior surgical limbus with a 64 Beaver blade and this was dissected anteriorly with an SYSCOlcon Crescent knife. The anterior chamber was entered at 10 o'clock with a 1.0 mm paracentesis knife and through the lamellar dissection with a 2.6 mm Alcon keratome. DiscoVisc was injected to replace the aqueous and a continuous tear curvilinear capsulorhexis was performed using a bent 25-gauge needle.  Balance salt on a syringe was used to perform hydro-dissection and phacoemulsification was carried out using a divide and conquer technique. Total ultrasound time was 1:15. The average ultrasonic power was 24.4. The CDE was 30.85.  Irrigation/aspiration was used to remove the residual cortex and the capsular bag was inflated with DiscoVisc. The intraocular lens was inserted into the capsular bag using a pre-loaded Acrysert Delivery System. Irrigation/aspiration was used to remove the residual DiscoVisc. The wound was  inflated with balanced salt and checked for leaks. None were found. Miostat was injected via the paracentesis track and 0.1 ml of cefuroxime containing 1 mg of drug  was injected via the paracentesis track. The wound was checked for leaks again and none were found.   The bridal sutures were removed and two drops of Vigamox were placed on the eye. An eye shield was placed to protect the eye and the patient was discharged to the recovery area in good condition.   Xiara Knisley MD

## 2014-08-11 NOTE — Anesthesia Preprocedure Evaluation (Signed)
Anesthesia Evaluation   Patient awake    Reviewed: Allergy & Precautions, NPO status , Patient's Chart, lab work & pertinent test results, reviewed documented beta blocker date and time   Airway Mallampati: II  TM Distance: >3 FB Neck ROM: Full    Dental  (+) Chipped   Pulmonary          Cardiovascular     Neuro/Psych  Headaches,    GI/Hepatic   Endo/Other    Renal/GU      Musculoskeletal  (+) Arthritis -,   Abdominal   Peds  Hematology   Anesthesia Other Findings   Reproductive/Obstetrics                             Anesthesia Physical Anesthesia Plan  ASA: II  Anesthesia Plan: MAC   Post-op Pain Management:    Induction:   Airway Management Planned: Nasal Cannula  Additional Equipment:   Intra-op Plan:   Post-operative Plan:   Informed Consent: I have reviewed the patients History and Physical, chart, labs and discussed the procedure including the risks, benefits and alternatives for the proposed anesthesia with the patient or authorized representative who has indicated his/her understanding and acceptance.     Plan Discussed with:   Anesthesia Plan Comments:         Anesthesia Quick Evaluation

## 2014-08-11 NOTE — OR Nursing (Signed)
Pt stated she has used mecuricron in the past.

## 2014-08-11 NOTE — Anesthesia Postprocedure Evaluation (Signed)
  Anesthesia Post-op Note  Patient: Erica Bernard  Procedure(s) Performed: Procedure(s) with comments: CATARACT EXTRACTION PHACO AND INTRAOCULAR LENS PLACEMENT (IOC) (Right) - US01:15 AP%24.4 CDE30.85  Anesthesia type:MAC  Patient location: PACU  Post pain: Pain level controlled  Post assessment: Post-op Vital signs reviewed, Patient's Cardiovascular Status Stable, Respiratory Function Stable, Patent Airway and No signs of Nausea or vomiting  Post vital signs: Reviewed and stable  Last Vitals:  Filed Vitals:   08/11/14 1059  BP: 149/80  Pulse: 48  Temp: 36.5 C  Resp: 16    Level of consciousness: awake, alert  and patient cooperative  Complications: No apparent anesthesia complications

## 2014-08-11 NOTE — Transfer of Care (Signed)
Immediate Anesthesia Transfer of Care Note  Patient: Erica SailsCynthia F Isidro  Procedure(s) Performed: Procedure(s) with comments: CATARACT EXTRACTION PHACO AND INTRAOCULAR LENS PLACEMENT (IOC) (Right) - US01:15 AP%24.4 CDE30.85  Patient Location: PACU  Anesthesia Type:MAC  Level of Consciousness: awake, alert  and oriented  Airway & Oxygen Therapy: Patient Spontanous Breathing  Post-op Assessment: Report given to RN and Post -op Vital signs reviewed and stable  Post vital signs: Reviewed and stable  Last Vitals:  Filed Vitals:   08/11/14 1059  BP: 149/80  Pulse: 48  Temp: 36.5 C  Resp: 16    Complications: No apparent anesthesia complications

## 2014-08-14 ENCOUNTER — Encounter: Payer: Self-pay | Admitting: Ophthalmology

## 2014-08-20 ENCOUNTER — Inpatient Hospital Stay: Admission: RE | Admit: 2014-08-20 | Payer: BLUE CROSS/BLUE SHIELD | Source: Ambulatory Visit

## 2014-08-24 NOTE — H&P (Signed)
  History and physical was faxed and scanned in.   

## 2014-08-25 ENCOUNTER — Encounter
Admission: RE | Disposition: A | Discharge status: Home / Self Care | Payer: Self-pay | Source: Ambulatory Visit | Attending: Ophthalmology

## 2014-08-25 ENCOUNTER — Ambulatory Visit: Payer: BLUE CROSS/BLUE SHIELD | Admitting: *Deleted

## 2014-08-25 ENCOUNTER — Ambulatory Visit
Admission: RE | Admit: 2014-08-25 | Discharge: 2014-08-25 | Disposition: A | Discharge status: Home / Self Care | Payer: BLUE CROSS/BLUE SHIELD | Source: Ambulatory Visit | Attending: Ophthalmology | Admitting: Ophthalmology

## 2014-08-25 DIAGNOSIS — Z882 Allergy status to sulfonamides status: Secondary | ICD-10-CM | POA: Insufficient documentation

## 2014-08-25 DIAGNOSIS — G43909 Migraine, unspecified, not intractable, without status migrainosus: Secondary | ICD-10-CM | POA: Insufficient documentation

## 2014-08-25 DIAGNOSIS — H2511 Age-related nuclear cataract, right eye: Secondary | ICD-10-CM | POA: Diagnosis not present

## 2014-08-25 DIAGNOSIS — Z9849 Cataract extraction status, unspecified eye: Secondary | ICD-10-CM | POA: Diagnosis not present

## 2014-08-25 DIAGNOSIS — E780 Pure hypercholesterolemia: Secondary | ICD-10-CM | POA: Diagnosis not present

## 2014-08-25 DIAGNOSIS — M810 Age-related osteoporosis without current pathological fracture: Secondary | ICD-10-CM | POA: Insufficient documentation

## 2014-08-25 DIAGNOSIS — Z79899 Other long term (current) drug therapy: Secondary | ICD-10-CM | POA: Insufficient documentation

## 2014-08-25 DIAGNOSIS — Z96653 Presence of artificial knee joint, bilateral: Secondary | ICD-10-CM | POA: Insufficient documentation

## 2014-08-25 HISTORY — PX: CATARACT EXTRACTION W/PHACO: SHX586

## 2014-08-25 SURGERY — PHACOEMULSIFICATION, CATARACT, WITH IOL INSERTION
Anesthesia: Monitor Anesthesia Care | Laterality: Left

## 2014-08-25 MED ORDER — PHENYLEPHRINE HCL 10 % OP SOLN
1.0000 [drp] | OPHTHALMIC | Status: AC
  Administered 2014-08-25 (×4): 1 [drp] via OPHTHALMIC

## 2014-08-25 MED ORDER — SODIUM CHLORIDE 0.9 % IV SOLN
INTRAVENOUS | Status: DC
  Administered 2014-08-25: 11:00:00 via INTRAVENOUS

## 2014-08-25 MED ORDER — EPINEPHRINE HCL 1 MG/ML IJ SOLN
INTRAOCULAR | Status: DC | PRN
  Administered 2014-08-25: 200 mL

## 2014-08-25 MED ORDER — TETRACAINE HCL 0.5 % OP SOLN
OPHTHALMIC | Status: AC
  Filled 2014-08-25: qty 2

## 2014-08-25 MED ORDER — NA CHONDROIT SULF-NA HYALURON 40-17 MG/ML IO SOLN
INTRAOCULAR | Status: DC | PRN
  Administered 2014-08-25: 1 mL via INTRAOCULAR

## 2014-08-25 MED ORDER — MOXIFLOXACIN HCL 0.5 % OP SOLN
OPHTHALMIC | Status: AC
  Administered 2014-08-25: 1 [drp] via OPHTHALMIC
  Filled 2014-08-25: qty 3

## 2014-08-25 MED ORDER — LIDOCAINE HCL (PF) 1 % IJ SOLN
INTRAOCULAR | Status: DC | PRN
  Administered 2014-08-25: 12:00:00 via OPHTHALMIC

## 2014-08-25 MED ORDER — MIDAZOLAM HCL 2 MG/2ML IJ SOLN
INTRAMUSCULAR | Status: DC | PRN
  Administered 2014-08-25 (×2): 1 mg via INTRAVENOUS

## 2014-08-25 MED ORDER — BUPIVACAINE HCL (PF) 0.75 % IJ SOLN
INTRAMUSCULAR | Status: AC
  Filled 2014-08-25: qty 10

## 2014-08-25 MED ORDER — CYCLOPENTOLATE HCL 2 % OP SOLN
OPHTHALMIC | Status: AC
  Administered 2014-08-25: 1 [drp] via OPHTHALMIC
  Filled 2014-08-25: qty 2

## 2014-08-25 MED ORDER — CEFUROXIME OPHTHALMIC INJECTION 1 MG/0.1 ML
INJECTION | OPHTHALMIC | Status: DC | PRN
  Administered 2014-08-25: 0.1 mL via INTRACAMERAL

## 2014-08-25 MED ORDER — NA CHONDROIT SULF-NA HYALURON 40-17 MG/ML IO SOLN
INTRAOCULAR | Status: AC
  Filled 2014-08-25: qty 1

## 2014-08-25 MED ORDER — TETRACAINE HCL 0.5 % OP SOLN
OPHTHALMIC | Status: DC | PRN
  Administered 2014-08-25: 1 [drp] via OPHTHALMIC

## 2014-08-25 MED ORDER — MOXIFLOXACIN HCL 0.5 % OP SOLN - NO CHARGE
OPHTHALMIC | Status: DC | PRN
  Administered 2014-08-25: 1 [drp] via OPHTHALMIC

## 2014-08-25 MED ORDER — CEFUROXIME OPHTHALMIC INJECTION 1 MG/0.1 ML
INJECTION | OPHTHALMIC | Status: AC
  Filled 2014-08-25: qty 0.1

## 2014-08-25 MED ORDER — CARBACHOL 0.01 % IO SOLN
INTRAOCULAR | Status: DC | PRN
  Administered 2014-08-25: 0.5 mL via INTRAOCULAR

## 2014-08-25 MED ORDER — CYCLOPENTOLATE HCL 2 % OP SOLN
1.0000 [drp] | OPHTHALMIC | Status: AC
  Administered 2014-08-25 (×4): 1 [drp] via OPHTHALMIC

## 2014-08-25 MED ORDER — MOXIFLOXACIN HCL 0.5 % OP SOLN
1.0000 [drp] | OPHTHALMIC | Status: AC
  Administered 2014-08-25 (×3): 1 [drp] via OPHTHALMIC

## 2014-08-25 MED ORDER — PHENYLEPHRINE HCL 10 % OP SOLN
OPHTHALMIC | Status: AC
  Administered 2014-08-25: 1 [drp] via OPHTHALMIC
  Filled 2014-08-25: qty 5

## 2014-08-25 MED ORDER — ALFENTANIL 500 MCG/ML IJ INJ
INJECTION | INTRAMUSCULAR | Status: DC | PRN
  Administered 2014-08-25 (×2): 500 ug via INTRAVENOUS

## 2014-08-25 MED ORDER — HYALURONIDASE HUMAN 150 UNIT/ML IJ SOLN
INTRAMUSCULAR | Status: AC
  Filled 2014-08-25: qty 1

## 2014-08-25 MED ORDER — MIDAZOLAM HCL 2 MG/2ML IJ SOLN: INTRAMUSCULAR | Status: DC | PRN

## 2014-08-25 MED ORDER — LIDOCAINE HCL (PF) 4 % IJ SOLN
INTRAMUSCULAR | Status: AC
  Filled 2014-08-25: qty 10

## 2014-08-25 MED ORDER — EPINEPHRINE HCL 1 MG/ML IJ SOLN
INTRAMUSCULAR | Status: AC
  Filled 2014-08-25: qty 2

## 2014-08-25 SURGICAL SUPPLY — 28 items
ACTIVE FMS ×2 IMPLANT
AUOOT024.0 ULTRASERT LENS ×2 IMPLANT
CORD BIP STRL DISP 12FT (MISCELLANEOUS) ×3 IMPLANT
DRAPE XRAY CASSETTE 23X24 (DRAPES) ×3 IMPLANT
ERASER HMR WETFIELD 18G (MISCELLANEOUS) ×3 IMPLANT
GLOVE BIO SURGEON STRL SZ8 (GLOVE) ×3 IMPLANT
GLOVE SURG LX 6.5 MICRO (GLOVE) ×2
GLOVE SURG LX 8.0 MICRO (GLOVE) ×2
GLOVE SURG LX STRL 6.5 MICRO (GLOVE) ×1 IMPLANT
GLOVE SURG LX STRL 8.0 MICRO (GLOVE) ×1 IMPLANT
GOWN STRL REUS W/ TWL LRG LVL3 (GOWN DISPOSABLE) ×1 IMPLANT
GOWN STRL REUS W/ TWL XL LVL3 (GOWN DISPOSABLE) ×1 IMPLANT
GOWN STRL REUS W/TWL LRG LVL3 (GOWN DISPOSABLE) ×3
GOWN STRL REUS W/TWL XL LVL3 (GOWN DISPOSABLE) ×3
LENS IOL ACRSF IQ ULTRA 24.0 (Intraocular Lens) IMPLANT
LENS IOL ACRYSOF IQ 24.0 (Intraocular Lens) ×3 IMPLANT
PACK CATARACT (MISCELLANEOUS) ×3 IMPLANT
PACK CATARACT DINGLEDEIN LX (MISCELLANEOUS) ×3 IMPLANT
PACK EYE AFTER SURG (MISCELLANEOUS) ×3 IMPLANT
SHLD EYE VISITEC  UNIV (MISCELLANEOUS) ×3 IMPLANT
SOL PREP PVP 2OZ (MISCELLANEOUS) ×3
SOLUTION PREP PVP 2OZ (MISCELLANEOUS) ×1 IMPLANT
SUT SILK 5-0 (SUTURE) ×3 IMPLANT
SYR 5ML LL (SYRINGE) ×3 IMPLANT
SYR TB 1ML 27GX1/2 LL (SYRINGE) ×3 IMPLANT
WATER STERILE IRR 1000ML POUR (IV SOLUTION) ×3 IMPLANT
WIPE NON LINTING 3.25X3.25 (MISCELLANEOUS) ×3 IMPLANT
auoot0 24.0 ultrasert lens ×2 IMPLANT

## 2014-08-25 NOTE — Anesthesia Postprocedure Evaluation (Signed)
  Anesthesia Post-op Note  Patient: Eriko F Kellenberger  Procedure(s) Performed: Procedure(s) with comments: CATARACT EXTRACTION PHACO AND INTRAOCULAR LENS PLACEMENT (IOC) (Left) - US 00:55 AP% 23.8 CDE 23.12  Anesthesia type:MAC  Patient location: PACU  Post pain: Pain level controlled  Post assessment: Post-op Vital signs reviewed, Patient's Cardiovascular Status Stable, Respiratory Function Stable, Patent Airway and No signs of Nausea or vomiting  Post vital signs: Reviewed and stable  Last Vitals:  Filed Vitals:   08/25/14 1238  BP: 149/79  Pulse: 54  Temp: 36.2 C  Resp: 12    Level of consciousness: awake, alert  and patient cooperative  Complications: No apparent anesthesia complications  

## 2014-08-25 NOTE — Op Note (Signed)
Date of Surgery: 08/25/2014 Date of Dictation: 08/25/2014 12:34 PM Pre-operative Diagnosis: Nuclear Sclerotic Cataract right Eye Post-operative Diagnosis: same Procedure performed: Extra-capsular Cataract Extraction (ECCE) with placement of a posterior chamber intraocular lens (IOL) right Eye IOL:  Implant Name Type Inv. Item Serial No. Manufacturer Lot No. LRB No. Used  [id number] 24.0 ultrasert lens     [id number] 080     Left 1   Anesthesia: 2% Lidocaine and 4% Marcaine in a 50/50 mixture with 10 unites/ml of Hylenex given as a peribulbar Anesthesiologist: Anesthesiologist: Linward NatalAmy Rice, MD CRNA: Irving BurtonJennifer Bachich, CRNA; Pearla DubonnetXiufen Huang, CRNA Complications: none Estimated Blood Loss: less than 1 ml  Description of procedure:  The patient was given anesthesia and sedation via intravenous access. The patient was then prepped and draped in the usual fashion. A 25-gauge needle was bent for initiating the capsulorhexis. A 5-0 silk suture was placed through the conjunctiva superior and inferiorly to serve as bridle sutures. Hemostasis was obtained at the superior limbus using an eraser cautery. A partial thickness groove was made at the anterior surgical limbus with a 64 Beaver blade and this was dissected anteriorly with an SY[employer] Crescent knife. The anterior chamber was entered at 10 o'clock with a 1.0 mm paracentesis knife and through the lamellar dissection with a 2.6 mm Alcon keratome. DiscoVisc was injected to replace the aqueous and a continuous tear curvilinear capsulorhexis was performed using a bent 25-gauge needle.  Balance salt on a syringe was used to perform hydro-dissection and phacoemulsification was carried out using a divide and conquer technique. Procedure(s) with comments: CATARACT EXTRACTION PHACO AND INTRAOCULAR LENS PLACEMENT (IOC) (Left) - US 00:55 AP% 23.8 CDE 23.12. Irrigation/aspiration was used to remove the residual cortex and the capsular bag was inflated with DiscoVisc. The  intraocular lens was inserted into the capsular bag using a pre-loaded UltraSert Delivery System. Irrigation/aspiration was used to remove the residual DiscoVisc. The wound was inflated with balanced salt and checked for leaks. None were found. Miostat was injected via the paracentesis track and 0.1 ml of cefuroxime containing 1 mg of drug  was injected via the paracentesis track. The wound was checked for leaks again and none were found.   The bridal sutures were removed and two drops of Vigamox were placed on the eye. An eye shield was placed to protect the eye and the patient was discharged to the recovery area in good condition.   Pamalee Marcoe MD

## 2014-08-25 NOTE — Anesthesia Postprocedure Evaluation (Signed)
  Anesthesia Post-op Note  Patient: Erica Bernard  Procedure(s) Performed: Procedure(s) with comments: CATARACT EXTRACTION PHACO AND INTRAOCULAR LENS PLACEMENT (IOC) (Left) - US 00:55 AP% 23.8 CDE 23.12  Anesthesia type:MAC  Patient location: PACU  Post pain: Pain level controlled  Post assessment: Post-op Vital signs reviewed, Patient's Cardiovascular Status Stable, Respiratory Function Stable, Patent Airway and No signs of Nausea or vomiting  Post vital signs: Reviewed and stable  Last Vitals:  Filed Vitals:   08/25/14 1238  BP: 149/79  Pulse: 54  Temp: 36.2 C  Resp: 12    Level of consciousness: awake, alert  and patient cooperative  Complications: No apparent anesthesia complications

## 2014-08-25 NOTE — Discharge Instructions (Addendum)
See handout.  Eye Surgery Discharge Instructions  Expect mild scratchy sensation or mild soreness. DO NOT RUB YOUR EYE!  The day of surgery: . Minimal physical activity, but bed rest is not required . No reading, computer work, or close hand work . No bending, lifting, or straining. . [place] watch TV  For 24 hours: . No driving, legal decisions, or alcoholic beverages . Safety precautions . Eat anything you prefer: It is better to start with liquids, then soup then solid foods. . __x___ Eye patch should be worn until postoperative exam tomorrow. . ____ Solar shield eyeglasses should be worn for comfort in the sunlight/patch while sleeping  Resume all regular medications including aspirin or Coumadin if these were discontinued prior to surgery. You [place] shower, bathe, shave, or wash your hair. Tylenol [place] be taken for mild discomfort.  Call your doctor if you experience significant pain, nausea, or vomiting, fever > 101 or other signs of infection. [phone] or [phone] Specific instructions:   

## 2014-08-25 NOTE — Progress Notes (Signed)
preop report given to Monica Jewel, RN by Dontee Jaso, RN 

## 2014-08-25 NOTE — Anesthesia Preprocedure Evaluation (Signed)
Anesthesia Evaluation  Patient identified by MRN, date of birth, ID band Patient awake    Reviewed: Allergy & Precautions, NPO status , Patient's Chart, lab work & pertinent test results  Airway Mallampati: II  TM Distance: >3 FB Neck ROM: Full    Dental  (+) Teeth Intact   Pulmonary    Pulmonary exam normal       Cardiovascular Exercise Tolerance: Good Normal cardiovascular exam    Neuro/Psych    GI/Hepatic   Endo/Other    Renal/GU      Musculoskeletal   Abdominal Normal abdominal exam  (+)   Peds  Hematology   Anesthesia Other Findings   Reproductive/Obstetrics                             Anesthesia Physical Anesthesia Plan  ASA: II  Anesthesia Plan: MAC   Post-op Pain Management:    Induction:   Airway Management Planned: Nasal Cannula  Additional Equipment:   Intra-op Plan:   Post-operative Plan:   Informed Consent: I have reviewed the patients History and Physical, chart, labs and discussed the procedure including the risks, benefits and alternatives for the proposed anesthesia with the patient or authorized representative who has indicated his/her understanding and acceptance.     Plan Discussed with: CRNA  Anesthesia Plan Comments:         Anesthesia Quick Evaluation

## 2014-08-25 NOTE — Transfer of Care (Signed)
Immediate Anesthesia Transfer of Care Note  Patient: Erica SailsCynthia F Bernard  Procedure(s) Performed: Procedure(s) with comments: CATARACT EXTRACTION PHACO AND INTRAOCULAR LENS PLACEMENT (IOC) (Left) - US 00:55 AP% 23.8 CDE 23.12  Patient Location: PACU  Anesthesia Type:MAC  Level of Consciousness: awake, alert  and oriented  Airway & Oxygen Therapy: Patient Spontanous Breathing  Post-op Assessment: Report given to RN  Post vital signs: stable  Last Vitals:  Filed Vitals:   08/25/14 1238  BP: 149/79  Pulse: 54  Temp: 36.2 C  Resp: 12    Complications: No apparent anesthesia complications

## 2014-08-25 NOTE — Interval H&P Note (Signed)
History and Physical Interval Note:  08/25/2014 11:57 AM  Erica Bernard  has presented today for surgery, with the diagnosis of CATARACT  The various methods of treatment have been discussed with the patient and family. After consideration of risks, benefits and other options for treatment, the patient has consented to  Procedure(s): CATARACT EXTRACTION PHACO AND INTRAOCULAR LENS PLACEMENT (IOC) (Left) as a surgical intervention .  The patient's history has been reviewed, patient examined, no change in status, stable for surgery.  I have reviewed the patient's chart and labs.  Questions were answered to the patient's satisfaction.     [name]

## 2014-08-26 ENCOUNTER — Encounter: Payer: Self-pay | Admitting: Ophthalmology

## 2015-02-25 ENCOUNTER — Other Ambulatory Visit: Payer: Self-pay | Admitting: Internal Medicine

## 2015-02-25 DIAGNOSIS — Z1231 Encounter for screening mammogram for malignant neoplasm of breast: Secondary | ICD-10-CM

## 2015-04-07 ENCOUNTER — Ambulatory Visit
Admission: RE | Admit: 2015-04-07 | Discharge: 2015-04-07 | Disposition: A | Discharge status: Home / Self Care | Payer: BLUE CROSS/BLUE SHIELD | Source: Ambulatory Visit | Attending: Internal Medicine | Admitting: Internal Medicine

## 2015-04-07 DIAGNOSIS — Z1231 Encounter for screening mammogram for malignant neoplasm of breast: Secondary | ICD-10-CM | POA: Insufficient documentation

## 2016-02-05 ENCOUNTER — Encounter: Payer: Self-pay | Admitting: *Deleted

## 2016-02-05 DIAGNOSIS — G43809 Other migraine, not intractable, without status migrainosus: Secondary | ICD-10-CM | POA: Insufficient documentation

## 2016-02-05 DIAGNOSIS — R51 Headache: Secondary | ICD-10-CM | POA: Diagnosis present

## 2016-02-05 LAB — CBC
HEMATOCRIT: 36.2 % (ref 35.0–47.0)
Hemoglobin: 12.5 g/dL (ref 12.0–16.0)
MCH: 31.2 pg (ref 26.0–34.0)
MCHC: 34.6 g/dL (ref 32.0–36.0)
MCV: 90 fL (ref 80.0–100.0)
PLATELETS: 253 10*3/uL (ref 150–440)
RBC: 4.02 MIL/uL (ref 3.80–5.20)
RDW: 13.2 % (ref 11.5–14.5)
WBC: 10.8 10*3/uL (ref 3.6–11.0)

## 2016-02-05 LAB — COMPREHENSIVE METABOLIC PANEL
ALBUMIN: 4.2 g/dL (ref 3.5–5.0)
ALK PHOS: 81 U/L (ref 38–126)
ALT: 14 U/L (ref 14–54)
AST: 20 U/L (ref 15–41)
Anion gap: 7 (ref 5–15)
BUN: 11 mg/dL (ref 6–20)
CO2: 27 mmol/L (ref 22–32)
CREATININE: 0.69 mg/dL (ref 0.44–1.00)
Calcium: 9.2 mg/dL (ref 8.9–10.3)
Chloride: 105 mmol/L (ref 101–111)
GFR calc Af Amer: >60 mL/min (ref >60)
GFR calc non Af Amer: >60 mL/min (ref >60)
GLUCOSE: 137 mg/dL — AB (ref 65–99)
POTASSIUM: 4 mmol/L (ref 3.5–5.1)
Sodium: 139 mmol/L (ref 135–145)
Total Bilirubin: 0.6 mg/dL (ref 0.3–1.2)
Total Protein: 8.1 g/dL (ref 6.5–8.1)

## 2016-02-05 LAB — LIPASE, BLOOD: Lipase: 18 U/L (ref 11–51)

## 2016-02-05 MED ORDER — ONDANSETRON HCL 4 MG/2ML IJ SOLN
4.0000 mg | Freq: Once | INTRAMUSCULAR | Status: AC | PRN
  Administered 2016-02-06: 4 mg via INTRAVENOUS

## 2016-02-05 NOTE — ED Triage Notes (Signed)
Pt presents w/ headache starting last night, n/v starting tonight at 1800. Pt has hx of migraines dx'd by a neurologist. Pt took Fiorecet this afternoon w/o relief. Pt is vomiting in triage, pale, and has worse pain w/ ambulation. Pt states she took a hydrocodone-APAP today w/o relief.

## 2016-02-06 ENCOUNTER — Emergency Department
Admission: EM | Admit: 2016-02-06 | Discharge: 2016-02-06 | Disposition: A | Discharge status: Home / Self Care | Payer: BLUE CROSS/BLUE SHIELD | Attending: Emergency Medicine | Admitting: Emergency Medicine

## 2016-02-06 DIAGNOSIS — G43809 Other migraine, not intractable, without status migrainosus: Secondary | ICD-10-CM

## 2016-02-06 MED ORDER — ACETAMINOPHEN 500 MG PO TABS
1000.0000 mg | ORAL_TABLET | Freq: Once | ORAL | Status: DC
  Filled 2016-02-06: qty 2

## 2016-02-06 MED ORDER — METOCLOPRAMIDE HCL 5 MG/ML IJ SOLN
10.0000 mg | Freq: Once | INTRAMUSCULAR | Status: AC
  Administered 2016-02-06: 10 mg via INTRAVENOUS
  Filled 2016-02-06: qty 2

## 2016-02-06 MED ORDER — DIPHENHYDRAMINE HCL 50 MG/ML IJ SOLN
25.0000 mg | Freq: Once | INTRAMUSCULAR | Status: AC
  Administered 2016-02-06: 25 mg via INTRAVENOUS
  Filled 2016-02-06: qty 1

## 2016-02-06 MED ORDER — OXYCODONE-ACETAMINOPHEN 5-325 MG PO TABS: 1.0000 | ORAL_TABLET | ORAL | 0 refills | Status: AC | PRN

## 2016-02-06 MED ORDER — SODIUM CHLORIDE 0.9 % IV BOLUS (SEPSIS)
500.0000 mL | Freq: Once | INTRAVENOUS | Status: AC
  Administered 2016-02-06: 500 mL via INTRAVENOUS

## 2016-02-06 MED ORDER — ONDANSETRON HCL 4 MG/2ML IJ SOLN
INTRAMUSCULAR | Status: AC
  Administered 2016-02-06: 4 mg via INTRAVENOUS
  Filled 2016-02-06: qty 2

## 2016-02-06 MED ORDER — MORPHINE SULFATE (PF) 2 MG/ML IV SOLN
INTRAVENOUS | Status: AC
  Filled 2016-02-06: qty 2

## 2016-02-06 MED ORDER — ONDANSETRON HCL 4 MG/2ML IJ SOLN
4.0000 mg | Freq: Once | INTRAMUSCULAR | Status: AC
  Administered 2016-02-06: 4 mg via INTRAVENOUS

## 2016-02-06 MED ORDER — MORPHINE SULFATE (PF) 2 MG/ML IV SOLN
4.0000 mg | Freq: Once | INTRAVENOUS | Status: AC
  Administered 2016-02-06: 4 mg via INTRAVENOUS

## 2016-02-06 NOTE — ED Notes (Signed)
Pt. States migraine headache for the past 2 days.  Pt. States hx of migraines.  Pt. States vomiting multiple times today.

## 2016-02-06 NOTE — ED Provider Notes (Signed)
[facility] Emergency Department Provider Note   First MD Initiated Contact with Patient 02/06/16 0136     (approximate)  I have reviewed the triage vital signs and the nursing notes.   HISTORY  Chief Complaint Migraine   HPI Erica Bernard is a 65 y.o. female with history of migraines presents to the emergency department with a 10 out of 10 generalized headache with onset last night that has been persistent since onset. Patient states that she took Fioricet as well as Norco which was prescribed for her migraines yesterday afternoon without any relief. Patient presented to the emergency department actively vomiting. Patient denies any weakness no numbness gait instability or visual changes. Patient states that pain is consistent with previous migraine headaches requiring emergency Department management.  Past Medical History:  Diagnosis Date  . Migraines    Takes Nadolol for Migraines  . Osteoarthritis of both knees     Patient Active Problem List   Diagnosis Date Noted  . Migraines   . Osteoarthritis of both knees     Past Surgical History:  Procedure Laterality Date  . ABDOMINAL HYSTERECTOMY  1982  . BREAST BIOPSY Right    negative 2007  . CATARACT EXTRACTION W/PHACO Right 08/11/2014   Procedure: CATARACT EXTRACTION PHACO AND INTRAOCULAR LENS PLACEMENT (IOC);  Surgeon: Sallee Lange, MD;  Location: [facility] ORS;  Service: Ophthalmology;  Laterality: Right;  US01:15 AP%24.4 CDE30.85  . CATARACT EXTRACTION W/PHACO Left 08/25/2014   Procedure: CATARACT EXTRACTION PHACO AND INTRAOCULAR LENS PLACEMENT (IOC);  Surgeon: Sallee Lange, MD;  Location: [facility] ORS;  Service: Ophthalmology;  Laterality: Left;  [place] 00:55 AP% 23.8 CDE 23.12  . KNEE ARTHROPLASTY Left 2011  . TOTAL KNEE ARTHROPLASTY Right 11/30/2012   Procedure: RIGHT TOTAL KNEE ARTHROPLASTY WITH MEIDAL AND LATERAL COMPARTMENT WITH PATELLA RESURFACING;  Surgeon: Thera Flake., MD;  Location: [facility]  OR;  Service: Orthopedics;  Laterality: Right;    Prior to Admission medications   Medication Sig Start Date End Date Taking? Authorizing Provider  alendronate (FOSAMAX) 70 MG tablet Take 70 mg by mouth once a week. Take with a full glass of water on an empty stomach.    Historical Provider, MD  atorvastatin (LIPITOR) 10 MG tablet Take 10 mg by mouth every other day.    Historical Provider, MD  butalbital-acetaminophen-caffeine (FIORICET, ESGIC) 50-325-40 MG per tablet Take 1 tablet by mouth once as needed for headache. Uses if Naproxen is ineffective    Historical Provider, MD  nadolol (CORGARD) 40 MG tablet Take 20 mg by mouth daily. Takes For Migraines NOT for heart or BP    Historical Provider, MD  oxyCODONE-acetaminophen (ROXICET) 5-325 MG tablet Take 1 tablet by mouth every 4 (four) hours as needed for severe pain. 02/06/16   Darci Current, MD    Allergies Iodine and Sulfa antibiotics  Family History  Problem Relation Age of Onset  . Breast cancer Maternal Aunt 25    Social History Social History  Substance Use Topics  . Smoking status: Never Smoker  . Smokeless tobacco: Never Used  . Alcohol use No    Review of Systems Constitutional: No fever/chills Eyes: No visual changes. ENT: No sore throat. Cardiovascular: Denies chest pain. Respiratory: Denies shortness of breath. Gastrointestinal: No abdominal pain.  No nausea, no vomiting.  No diarrhea.  No constipation. Genitourinary: Negative for dysuria. Musculoskeletal: Negative for back pain. Skin: Negative for rash. Neurological:Positive for headaches, negative for focal weakness or numbness.  10-point ROS  otherwise negative.  ____________________________________________   PHYSICAL EXAM:  VITAL SIGNS: ED Triage Vitals  Enc Vitals Group     BP 02/05/16 2158 (!) 176/84     Pulse Rate 02/05/16 2158 65     Resp 02/05/16 2158 16     Temp 02/05/16 2158 98.8 F (37.1 C)     Temp Source 02/05/16 2158 Oral      SpO2 02/05/16 2158 97 %     Weight 02/05/16 2201 162 lb (73.5 kg)     Height 02/05/16 2201 5\' 5"  (1.651 m)     Head Circumference --      Peak Flow --      Pain Score 02/05/16 2202 10     Pain Loc --      Pain Edu? --      Excl. in GC? --     Constitutional: Alert and oriented. Apparent discomfort Eyes: Conjunctivae are normal. PERRL. EOMI. Head: Atraumatic. Ears:  Healthy appearing ear canals and TMs bilaterally Nose: No congestion/rhinnorhea. Mouth/Throat: Mucous membranes are moist.  Oropharynx non-erythematous. Neck: No stridor.  No meningeal signs.   Cardiovascular: Normal rate, regular rhythm. Good peripheral circulation. Grossly normal heart sounds. Respiratory: Normal respiratory effort.  No retractions. Lungs CTAB. Gastrointestinal: Soft and nontender. No distention.  Musculoskeletal: No lower extremity tenderness nor edema. No gross deformities of extremities. Neurologic:  Normal speech and language. No gross focal neurologic deficits are appreciated.  Skin:  Skin is warm, dry and intact. No rash noted. Psychiatric: Mood and affect are normal. Speech and behavior are normal.  ____________________________________________   LABS (all labs ordered are listed, but only abnormal results are displayed)  Labs Reviewed  COMPREHENSIVE METABOLIC PANEL - Abnormal; Notable for the following:       Result Value   Glucose, Bld 137 (*)    All other components within normal limits  LIPASE, BLOOD  CBC  URINALYSIS COMPLETEWITH MICROSCOPIC ([facility] ONLY)    Procedures      INITIAL IMPRESSION / ASSESSMENT AND PLAN / ED COURSE  Pertinent labs & imaging results that were available during my care of the patient were reviewed by me and considered in my medical decision making (see chart for details).  Patient's headache completely resolved before discharge from the emergency department. Clinical Course    ____________________________________________  FINAL CLINICAL  IMPRESSION(S) / ED DIAGNOSES  Final diagnoses:  Other migraine without status migrainosus, not intractable     MEDICATIONS GIVEN DURING THIS VISIT:  Medications  acetaminophen (TYLENOL) tablet 1,000 mg (1,000 mg Oral Refused 02/06/16 0125)  ondansetron (ZOFRAN) injection 4 mg (4 mg Intravenous Given 02/06/16 0100)  metoCLOPramide (REGLAN) injection 10 mg (10 mg Intravenous Given 02/06/16 0124)  diphenhydrAMINE (BENADRYL) injection 25 mg (25 mg Intravenous Given 02/06/16 0124)  sodium chloride 0.9 % bolus 500 mL (500 mLs Intravenous New Bag/Given 02/06/16 0125)  morphine 2 MG/ML injection 4 mg (4 mg Intravenous Given 02/06/16 0224)  ondansetron (ZOFRAN) injection 4 mg (4 mg Intravenous Given 02/06/16 0224)     NEW OUTPATIENT MEDICATIONS STARTED DURING THIS VISIT:  New Prescriptions   OXYCODONE-ACETAMINOPHEN (ROXICET) 5-325 MG TABLET    Take 1 tablet by mouth every 4 (four) hours as needed for severe pain.    Modified Medications   No medications on file    Discontinued Medications   No medications on file     Note:  This document was prepared using Dragon voice recognition software and Tessier include unintentional dictation errors.    Enedina Finnerandolph N  Manson PasseyBrown, MD 02/12/16 0430

## 2016-02-06 NOTE — ED Notes (Signed)
Pt. Going home with husband. 

## 2016-03-16 ENCOUNTER — Other Ambulatory Visit: Payer: Self-pay | Admitting: Internal Medicine

## 2016-03-16 DIAGNOSIS — Z1239 Encounter for other screening for malignant neoplasm of breast: Secondary | ICD-10-CM

## 2016-04-26 ENCOUNTER — Ambulatory Visit: Payer: BLUE CROSS/BLUE SHIELD

## 2016-05-17 ENCOUNTER — Ambulatory Visit
Admission: RE | Admit: 2016-05-17 | Discharge: 2016-05-17 | Disposition: A | Discharge status: Home / Self Care | Payer: BLUE CROSS/BLUE SHIELD | Source: Ambulatory Visit | Attending: Internal Medicine | Admitting: Internal Medicine

## 2016-05-17 DIAGNOSIS — Z1239 Encounter for other screening for malignant neoplasm of breast: Secondary | ICD-10-CM

## 2016-05-17 DIAGNOSIS — Z1231 Encounter for screening mammogram for malignant neoplasm of breast: Secondary | ICD-10-CM | POA: Diagnosis not present

## 2017-04-03 ENCOUNTER — Other Ambulatory Visit: Payer: Self-pay | Admitting: Internal Medicine

## 2017-04-03 DIAGNOSIS — Z1231 Encounter for screening mammogram for malignant neoplasm of breast: Secondary | ICD-10-CM

## 2017-05-08 ENCOUNTER — Other Ambulatory Visit: Payer: Self-pay

## 2017-05-08 ENCOUNTER — Encounter: Payer: Self-pay | Admitting: Emergency Medicine

## 2017-05-08 ENCOUNTER — Emergency Department: Payer: BLUE CROSS/BLUE SHIELD

## 2017-05-08 ENCOUNTER — Emergency Department
Admission: EM | Admit: 2017-05-08 | Discharge: 2017-05-08 | Disposition: A | Discharge status: Home / Self Care | Payer: BLUE CROSS/BLUE SHIELD | Attending: Emergency Medicine | Admitting: Emergency Medicine

## 2017-05-08 DIAGNOSIS — G43709 Chronic migraine without aura, not intractable, without status migrainosus: Secondary | ICD-10-CM | POA: Insufficient documentation

## 2017-05-08 DIAGNOSIS — Z79899 Other long term (current) drug therapy: Secondary | ICD-10-CM | POA: Diagnosis not present

## 2017-05-08 DIAGNOSIS — Z96653 Presence of artificial knee joint, bilateral: Secondary | ICD-10-CM | POA: Insufficient documentation

## 2017-05-08 DIAGNOSIS — R51 Headache: Secondary | ICD-10-CM | POA: Diagnosis present

## 2017-05-08 MED ORDER — DIPHENHYDRAMINE HCL 50 MG/ML IJ SOLN
50.0000 mg | Freq: Once | INTRAMUSCULAR | Status: AC
  Administered 2017-05-08: 50 mg via INTRAMUSCULAR
  Filled 2017-05-08: qty 1

## 2017-05-08 MED ORDER — PROMETHAZINE HCL 25 MG/ML IJ SOLN
12.5000 mg | Freq: Once | INTRAMUSCULAR | Status: AC
  Administered 2017-05-08: 12.5 mg via INTRAMUSCULAR
  Filled 2017-05-08: qty 1

## 2017-05-08 MED ORDER — FLUTICASONE PROPIONATE 50 MCG/ACT NA SUSP: 1.0000 | Freq: Two times a day (BID) | NASAL | 0 refills | Status: AC

## 2017-05-08 MED ORDER — BUTALBITAL-APAP-CAFFEINE 50-325-40 MG PO TABS
1.0000 | ORAL_TABLET | Freq: Four times a day (QID) | ORAL | 0 refills | Status: AC | PRN
Start: 2017-05-08 — End: 2018-05-08

## 2017-05-08 MED ORDER — KETOROLAC TROMETHAMINE 30 MG/ML IJ SOLN
15.0000 mg | Freq: Once | INTRAMUSCULAR | Status: AC
  Administered 2017-05-08: 15 mg via INTRAMUSCULAR
  Filled 2017-05-08: qty 1

## 2017-05-08 NOTE — ED Provider Notes (Signed)
[facility] Emergency Department Provider Note  ____________________________________________  Time seen: Approximately 4:10 PM  I have reviewed the triage vital signs and the nursing notes.   HISTORY  Chief Complaint Headache    HPI Erica Bernard is a 67 y.o. female who presents emergency emergency department complaining of right parietal/occipital headache.  Patient reports that she has a history of migraines but typically does not experience a headache in this region.  Patient is on daily Topamax as well as sumatriptan as needed for headaches.  Patient is taking both of these medications with no improvement of her headaches.  Patient denies any recent trauma.  She denies any visual changes, neck pain or stiffness, fevers or chills, chest pain, shortness of breath abdominal pain, nausea vomiting.  Patient presents as this headache is more severe, in a different location than her typical migraine.  No other complaints.  Other than sumatriptan and Topamax, no medications for this complaint prior to arrival.   Past Medical History:  Diagnosis Date  . Migraines    Takes Nadolol for Migraines  . Osteoarthritis of both knees     Patient Active Problem List   Diagnosis Date Noted  . Migraines   . Osteoarthritis of both knees     Past Surgical History:  Procedure Laterality Date  . ABDOMINAL HYSTERECTOMY  1982  . BREAST BIOPSY Right    negative 2007 core  . CATARACT EXTRACTION W/PHACO Right 08/11/2014   Procedure: CATARACT EXTRACTION PHACO AND INTRAOCULAR LENS PLACEMENT (IOC);  Surgeon: Sallee LangeSteven Dingeldein, MD;  Location: [facility] ORS;  Service: Ophthalmology;  Laterality: Right;  US01:15 AP%24.4 CDE30.85  . CATARACT EXTRACTION W/PHACO Left 08/25/2014   Procedure: CATARACT EXTRACTION PHACO AND INTRAOCULAR LENS PLACEMENT (IOC);  Surgeon: Sallee LangeSteven Dingeldein, MD;  Location: [facility] ORS;  Service: Ophthalmology;  Laterality: Left;  US 00:55 AP% 23.8 CDE 23.12  . KNEE  ARTHROPLASTY Left 2011  . TOTAL KNEE ARTHROPLASTY Right 11/30/2012   Procedure: RIGHT TOTAL KNEE ARTHROPLASTY WITH MEIDAL AND LATERAL COMPARTMENT WITH PATELLA RESURFACING;  Surgeon: Thera FlakeW D Caffrey Jr., MD;  Location: [facility] OR;  Service: Orthopedics;  Laterality: Right;    Prior to Admission medications   Medication Sig Start Date End Date Taking? Authorizing Provider  alendronate (FOSAMAX) 70 MG tablet Take 70 mg by mouth once a week. Take with a full glass of water on an empty stomach.    [provider]  atorvastatin (LIPITOR) 10 MG tablet Take 10 mg by mouth every other day.    [provider]  butalbital-acetaminophen-caffeine (FIORICET, ESGIC) 50-325-40 MG tablet Take 1-2 tablets by mouth every 6 (six) hours as needed for headache. 05/08/17 05/08/18  Anthoney Sheppard, Delorise RoyalsJonathan D, PA-C  fluticasone (FLONASE) 50 MCG/ACT nasal spray Place 1 spray into both nostrils 2 (two) times daily. 05/08/17   Kolton Kienle, Delorise RoyalsJonathan D, PA-C  nadolol (CORGARD) 40 MG tablet Take 20 mg by mouth daily. Takes For Migraines NOT for heart or BP    [provider]  oxyCODONE-acetaminophen (ROXICET) 5-325 MG tablet Take 1 tablet by mouth every 4 (four) hours as needed for severe pain. 02/06/16   Darci CurrentBrown, Hooks N, MD    Allergies Iodine and Sulfa antibiotics  Family History  Problem Relation Age of Onset  . Breast cancer Maternal Aunt [id number]    Social History Social History   Tobacco Use  . Smoking status: Never Smoker  . Smokeless tobacco: Never Used  Substance Use Topics  . Alcohol use: No  . Drug use: No  Review of Systems  Constitutional: No fever/chills Eyes: No visual changes. No discharge ENT: No upper respiratory complaints. Cardiovascular: no chest pain. Respiratory: no cough. No SOB. Gastrointestinal: No abdominal pain.  No nausea, no vomiting.   Musculoskeletal: Negative for musculoskeletal pain. Skin: Negative for rash, abrasions, lacerations, ecchymosis. Neurological: Positive  for headache but denies focal weakness or numbness. 10-point ROS otherwise negative.  ____________________________________________   PHYSICAL EXAM:  VITAL SIGNS: ED Triage Vitals  Enc Vitals Group     BP 05/08/17 1305 136/70     Pulse Rate 05/08/17 1305 67     Resp 05/08/17 1305 16     Temp 05/08/17 1305 98.2 F (36.8 C)     Temp Source 05/08/17 1305 Oral     SpO2 05/08/17 1305 100 %     Weight 05/08/17 1306 164 lb (74.4 kg)     Height 05/08/17 1306 5\' 4"  (1.626 m)     Head Circumference --      Peak Flow --      Pain Score 05/08/17 1306 10     Pain Loc --      Pain Edu? --      Excl. in GC? --      Constitutional: Alert and oriented. Well appearing and in no acute distress. Eyes: Conjunctivae are normal. PERRL. EOMI. Head: Atraumatic. ENT:      Ears:       Nose: No congestion/rhinnorhea.      Mouth/Throat: Mucous membranes are moist.  Neck: No stridor.  No cervical spine tenderness to palpation. Hematological/Lymphatic/Immunilogical: No cervical lymphadenopathy. Cardiovascular: Normal rate, regular rhythm. Normal S1 and S2.  Good peripheral circulation. Respiratory: Normal respiratory effort without tachypnea or retractions. Lungs CTAB. Good air entry to the bases with no decreased or absent breath sounds. Musculoskeletal: Full range of motion to all extremities. No gross deformities appreciated. Neurologic:  Normal speech and language. No gross focal neurologic deficits are appreciated.  Nerves II through XII grossly intact.  Negative Romberg's or pronator drift. Skin:  Skin is warm, dry and intact. No rash noted. Psychiatric: Mood and affect are normal. Speech and behavior are normal. Patient exhibits appropriate insight and judgement.   ____________________________________________   LABS (all labs ordered are listed, but only abnormal results are displayed)  Labs Reviewed - No data to  display ____________________________________________  EKG   ____________________________________________  RADIOLOGY Festus Barren Harlow Basley, personally viewed and evaluated these images (plain radiographs) as part of my medical decision making, as well as reviewing the written report by the radiologist.  Ct Head Wo Contrast  Result Date: 05/08/2017 CLINICAL DATA:  Headache, acute, severe, worst of life. Right parietal headache since last night. EXAM: CT HEAD WITHOUT CONTRAST TECHNIQUE: Contiguous axial images were obtained from the base of the skull through the vertex without intravenous contrast. COMPARISON:  None. FINDINGS: Brain: No evidence of acute infarction, hemorrhage, hydrocephalus, extra-axial collection or mass lesion/mass effect. Nonspecific subcortical low-density in the high left frontal lobe without mass effect, likely old insult or dilated perivascular space. Vascular: No hyperdense vessel or unexpected calcification. Skull: Normal. Negative for fracture or focal lesion. Sinuses/Orbits: Frothy secretions in the right maxillary sinus. No visible associated mucosal thickening. Bilateral cataract resection. IMPRESSION: 1. Negative for intracranial hemorrhage. 2. Frothy secretions in the right maxillary sinus, please correlate for sinusitis symptoms. Electronically Signed   By: Marnee Spring M.D.   On: 05/08/2017 13:34    ____________________________________________    PROCEDURES  Procedure(s) performed:    Procedures  Medications  diphenhydrAMINE (BENADRYL) injection 50 mg (50 mg Intramuscular Given 05/08/17 1633)  promethazine (PHENERGAN) injection 12.5 mg (12.5 mg Intramuscular Given 05/08/17 1633)  ketorolac (TORADOL) 30 MG/ML injection 15 mg (15 mg Intramuscular Given 05/08/17 1633)     ____________________________________________   INITIAL IMPRESSION / ASSESSMENT AND PLAN / ED COURSE  Pertinent labs & imaging results that were available during my care of the  patient were reviewed by me and considered in my medical decision making (see chart for details).  Review of the [place] CSRS was performed in accordance of the [facility] prior to dispensing any controlled drugs.     Patient's diagnosis is consistent with migraine headache.  Patient presented to emerge right-sided headache.  Patient does have a history of migraines but states that this headache was different.  Differential included mass, intracranial hemorrhage, migraine headache, new headache syndrome.  Patient does have some inflammation on CT scan in the sinuses.  Patient denies any did not meet clinical findings for sinusitis.  However, I will treat patient with Flonase to reduce inflammation.  At this time, patient's exam was reassuring with no acute neurological deficits.  Patient will be given migraine cocktail emergency department discharge.  Patient is on Topamax as well as sumatriptan for her headaches.  She is to continue these medications.  Patient will be discharged home with prescriptions for Flonase and Fioricet. Patient is to follow up with primary care as needed or otherwise directed. Patient is given ED precautions to return to the ED for any worsening or new symptoms.     ____________________________________________  FINAL CLINICAL IMPRESSION(S) / ED DIAGNOSES  Final diagnoses:  Chronic migraine without aura without status migrainosus, not intractable      NEW MEDICATIONS STARTED DURING THIS VISIT:  ED Discharge Orders        Ordered    fluticasone (FLONASE) 50 MCG/ACT nasal spray  2 times daily     05/08/17 1620    butalbital-acetaminophen-caffeine (FIORICET, ESGIC) 50-325-40 MG tablet  Every 6 hours PRN     05/08/17 1620          This chart was dictated using voice recognition software/Dragon. Despite best efforts to proofread, errors can occur which can change the meaning. Any change was purely unintentional.    Racheal Patches, PA-C 05/08/17 1707     Sharyn Creamer, MD 05/09/17 [phone]

## 2017-05-08 NOTE — ED Triage Notes (Signed)
Says right side headache since 7pm yest.  Says it just hits her about every 30 seconds.

## 2017-05-08 NOTE — ED Notes (Signed)
Per Christiane HaJonathan, [place], pt flex appropriate.

## 2017-05-08 NOTE — ED Notes (Signed)
See triage note  Presents with headache since last pm  No fever or n/v

## 2017-05-22 ENCOUNTER — Ambulatory Visit
Admission: RE | Admit: 2017-05-22 | Discharge: 2017-05-22 | Disposition: A | Discharge status: Home / Self Care | Payer: BLUE CROSS/BLUE SHIELD | Source: Ambulatory Visit | Attending: Internal Medicine | Admitting: Internal Medicine

## 2017-05-22 DIAGNOSIS — Z1231 Encounter for screening mammogram for malignant neoplasm of breast: Secondary | ICD-10-CM | POA: Diagnosis not present

## 2018-03-29 ENCOUNTER — Other Ambulatory Visit: Payer: Self-pay | Admitting: Internal Medicine

## 2018-03-29 DIAGNOSIS — Z1231 Encounter for screening mammogram for malignant neoplasm of breast: Secondary | ICD-10-CM

## 2018-05-23 ENCOUNTER — Ambulatory Visit
Admission: RE | Admit: 2018-05-23 | Discharge: 2018-05-23 | Disposition: A | Discharge status: Home / Self Care | Payer: Commercial Managed Care - PPO | Source: Ambulatory Visit | Attending: Internal Medicine | Admitting: Internal Medicine

## 2018-05-23 DIAGNOSIS — Z1231 Encounter for screening mammogram for malignant neoplasm of breast: Secondary | ICD-10-CM | POA: Diagnosis present

## 2018-11-22 ENCOUNTER — Other Ambulatory Visit: Payer: Self-pay

## 2018-11-22 DIAGNOSIS — Z20822 Contact with and (suspected) exposure to covid-19: Secondary | ICD-10-CM

## 2018-11-23 LAB — NOVEL CORONAVIRUS, NAA: SARS-CoV-2, NAA: NOT DETECTED

## 2018-11-26 ENCOUNTER — Telehealth: Payer: Self-pay | Admitting: Internal Medicine

## 2018-11-26 NOTE — Telephone Encounter (Signed)
Negative COVID results given. Patient results "NOT Detected." Caller expressed understanding. ° °

## 2019-03-04 ENCOUNTER — Other Ambulatory Visit: Payer: Self-pay

## 2019-03-04 DIAGNOSIS — Z20822 Contact with and (suspected) exposure to covid-19: Secondary | ICD-10-CM

## 2019-03-06 LAB — NOVEL CORONAVIRUS, NAA: SARS-CoV-2, NAA: DETECTED — AB

## 2019-03-07 ENCOUNTER — Telehealth: Payer: Self-pay | Admitting: Nurse Practitioner

## 2019-03-07 NOTE — Telephone Encounter (Signed)
Called to Discuss with patient about Covid symptoms and the use of bamlanivimab, a monoclonal antibody infusion for those with mild to moderate Covid symptoms and at a high risk of hospitalization.     Pt is qualified for this infusion at the [facility] infusion center due to co-morbid conditions and/or a member of an at-risk group.  Patient is at risk due to age.   Patient denies any symptoms and due to this does not qualify for treatment.

## 2019-04-09 ENCOUNTER — Other Ambulatory Visit: Payer: Self-pay | Admitting: Internal Medicine

## 2019-04-09 DIAGNOSIS — Z1231 Encounter for screening mammogram for malignant neoplasm of breast: Secondary | ICD-10-CM

## 2019-04-29 ENCOUNTER — Ambulatory Visit: Payer: Medicare HMO | Attending: Internal Medicine

## 2019-04-29 DIAGNOSIS — Z20822 Contact with and (suspected) exposure to covid-19: Secondary | ICD-10-CM

## 2019-04-30 LAB — NOVEL CORONAVIRUS, NAA: SARS-CoV-2, NAA: NOT DETECTED

## 2019-05-27 ENCOUNTER — Ambulatory Visit
Admission: RE | Admit: 2019-05-27 | Discharge: 2019-05-27 | Disposition: A | Discharge status: Home / Self Care | Payer: Medicare HMO | Source: Ambulatory Visit | Attending: Internal Medicine | Admitting: Internal Medicine

## 2019-05-27 DIAGNOSIS — Z1231 Encounter for screening mammogram for malignant neoplasm of breast: Secondary | ICD-10-CM

## 2019-06-02 ENCOUNTER — Ambulatory Visit: Payer: Medicare HMO | Attending: Internal Medicine

## 2019-06-02 DIAGNOSIS — Z23 Encounter for immunization: Secondary | ICD-10-CM | POA: Insufficient documentation

## 2019-06-02 NOTE — Progress Notes (Signed)
   Covid-19 Vaccination Clinic  Name:  Erica Bernard    MRN: [id number] DOB: 09-09-1950  06/02/2019  [name] was observed post Covid-19 immunization for 15 minutes without incidence. She was provided with Vaccine Information Sheet and instruction to access the V-Safe system.   [name] was instructed to call 911 with any severe reactions post vaccine: Marland Kitchen Difficulty breathing  . Swelling of your face and throat  . A fast heartbeat  . A bad rash all over your body  . Dizziness and weakness    Immunizations Administered    Name Date Dose VIS Date Route   Pfizer COVID-19 Vaccine 06/02/2019  1:19 PM 0.3 mL 03/15/2019 Intramuscular   Manufacturer: [employer], [employer]   Lot: [id number]   NDC: [address]-0

## 2019-06-25 ENCOUNTER — Ambulatory Visit: Payer: Medicare HMO | Attending: Internal Medicine

## 2019-06-25 DIAGNOSIS — Z23 Encounter for immunization: Secondary | ICD-10-CM

## 2019-06-25 NOTE — Progress Notes (Signed)
   Covid-19 Vaccination Clinic  Name:  Erica Bernard    MRN: [id number] DOB: [birth year]  06/25/2019  [name] was observed post Covid-19 immunization for 15 minutes without incident. She was provided with Vaccine Information Sheet and instruction to access the V-Safe system.   [name] was instructed to call 911 with any severe reactions post vaccine: Marland Kitchen Difficulty breathing  . Swelling of face and throat  . A fast heartbeat  . A bad rash all over body  . Dizziness and weakness   Immunizations Administered    Name Date Dose VIS Date Route   Pfizer COVID-19 Vaccine 06/25/2019  8:53 AM 0.3 mL 03/15/2019 Intramuscular   Manufacturer: [employer], [employer]   Lot: [id number]   NDC: [address]-0

## 2020-04-14 ENCOUNTER — Other Ambulatory Visit: Payer: Self-pay | Admitting: Internal Medicine

## 2020-04-14 ENCOUNTER — Other Ambulatory Visit: Payer: Self-pay | Admitting: Medical Oncology

## 2020-04-14 DIAGNOSIS — Z1231 Encounter for screening mammogram for malignant neoplasm of breast: Secondary | ICD-10-CM

## 2020-05-27 ENCOUNTER — Other Ambulatory Visit: Payer: Self-pay

## 2020-05-27 ENCOUNTER — Ambulatory Visit
Admission: RE | Admit: 2020-05-27 | Discharge: 2020-05-27 | Disposition: A | Discharge status: Home / Self Care | Payer: Medicare HMO | Source: Ambulatory Visit | Attending: Internal Medicine | Admitting: Internal Medicine

## 2020-05-27 DIAGNOSIS — Z1231 Encounter for screening mammogram for malignant neoplasm of breast: Secondary | ICD-10-CM | POA: Insufficient documentation

## 2020-06-01 ENCOUNTER — Other Ambulatory Visit: Payer: Self-pay | Admitting: Internal Medicine

## 2020-06-01 DIAGNOSIS — N6489 Other specified disorders of breast: Secondary | ICD-10-CM

## 2020-06-01 DIAGNOSIS — R928 Other abnormal and inconclusive findings on diagnostic imaging of breast: Secondary | ICD-10-CM

## 2020-06-09 ENCOUNTER — Ambulatory Visit
Admission: RE | Admit: 2020-06-09 | Discharge: 2020-06-09 | Disposition: A | Discharge status: Home / Self Care | Payer: Medicare HMO | Source: Ambulatory Visit | Attending: Internal Medicine | Admitting: Internal Medicine

## 2020-06-09 ENCOUNTER — Other Ambulatory Visit: Payer: Self-pay

## 2020-06-09 DIAGNOSIS — R928 Other abnormal and inconclusive findings on diagnostic imaging of breast: Secondary | ICD-10-CM | POA: Insufficient documentation

## 2020-06-09 DIAGNOSIS — N6489 Other specified disorders of breast: Secondary | ICD-10-CM | POA: Insufficient documentation

## 2021-04-20 ENCOUNTER — Other Ambulatory Visit: Payer: Self-pay | Admitting: Internal Medicine

## 2021-04-20 DIAGNOSIS — Z1231 Encounter for screening mammogram for malignant neoplasm of breast: Secondary | ICD-10-CM

## 2021-05-31 ENCOUNTER — Other Ambulatory Visit: Payer: Self-pay

## 2021-05-31 ENCOUNTER — Ambulatory Visit
Admission: RE | Admit: 2021-05-31 | Discharge: 2021-05-31 | Disposition: A | Discharge status: Home / Self Care | Payer: Medicare HMO | Source: Ambulatory Visit | Attending: Internal Medicine | Admitting: Internal Medicine

## 2021-05-31 DIAGNOSIS — Z1231 Encounter for screening mammogram for malignant neoplasm of breast: Secondary | ICD-10-CM | POA: Insufficient documentation

## 2022-01-21 ENCOUNTER — Ambulatory Visit: Payer: Medicare HMO | Admitting: Obstetrics and Gynecology

## 2022-01-24 ENCOUNTER — Encounter: Payer: Self-pay | Admitting: *Deleted

## 2022-04-19 ENCOUNTER — Other Ambulatory Visit: Payer: Self-pay | Admitting: Internal Medicine

## 2022-04-19 DIAGNOSIS — Z1231 Encounter for screening mammogram for malignant neoplasm of breast: Secondary | ICD-10-CM

## 2022-06-01 ENCOUNTER — Ambulatory Visit
Admission: RE | Admit: 2022-06-01 | Discharge: 2022-06-01 | Disposition: A | Discharge status: Home / Self Care | Payer: Medicare HMO | Source: Ambulatory Visit | Attending: Internal Medicine | Admitting: Internal Medicine

## 2022-06-01 DIAGNOSIS — Z1231 Encounter for screening mammogram for malignant neoplasm of breast: Secondary | ICD-10-CM

## 2022-06-08 ENCOUNTER — Other Ambulatory Visit: Payer: Self-pay | Admitting: Internal Medicine

## 2022-06-08 DIAGNOSIS — R928 Other abnormal and inconclusive findings on diagnostic imaging of breast: Secondary | ICD-10-CM

## 2022-06-08 DIAGNOSIS — R921 Mammographic calcification found on diagnostic imaging of breast: Secondary | ICD-10-CM

## 2022-06-14 ENCOUNTER — Ambulatory Visit
Admission: RE | Admit: 2022-06-14 | Discharge: 2022-06-14 | Disposition: A | Discharge status: Home / Self Care | Payer: Medicare HMO | Source: Ambulatory Visit | Attending: Internal Medicine | Admitting: Internal Medicine

## 2022-06-14 DIAGNOSIS — R928 Other abnormal and inconclusive findings on diagnostic imaging of breast: Secondary | ICD-10-CM

## 2022-06-14 DIAGNOSIS — R921 Mammographic calcification found on diagnostic imaging of breast: Secondary | ICD-10-CM

## 2023-02-23 ENCOUNTER — Other Ambulatory Visit: Payer: Self-pay | Admitting: Internal Medicine

## 2023-02-23 DIAGNOSIS — Z1231 Encounter for screening mammogram for malignant neoplasm of breast: Secondary | ICD-10-CM

## 2023-06-06 ENCOUNTER — Ambulatory Visit
Admission: RE | Admit: 2023-06-06 | Discharge: 2023-06-06 | Disposition: A | Discharge status: Home / Self Care | Payer: Medicare HMO | Source: Ambulatory Visit | Attending: Internal Medicine | Admitting: Internal Medicine

## 2023-06-06 DIAGNOSIS — Z1231 Encounter for screening mammogram for malignant neoplasm of breast: Secondary | ICD-10-CM | POA: Diagnosis present

## 2023-11-17 IMAGING — MG MM DIGITAL SCREENING BILAT W/ TOMO AND CAD
8 series · 8 of 24 positions shown · non-contrast
Comparison: Previous exam(s).

CLINICAL DATA: Screening.

EXAM:
DIGITAL SCREENING BILATERAL MAMMOGRAM WITH TOMOSYNTHESIS AND CAD
TECHNIQUE: Bilateral screening digital craniocaudal and mediolateral oblique
mammograms were obtained. Bilateral screening digital breast
tomosynthesis was performed. The images were evaluated with
computer-aided detection.

[R MLO synth-2D]
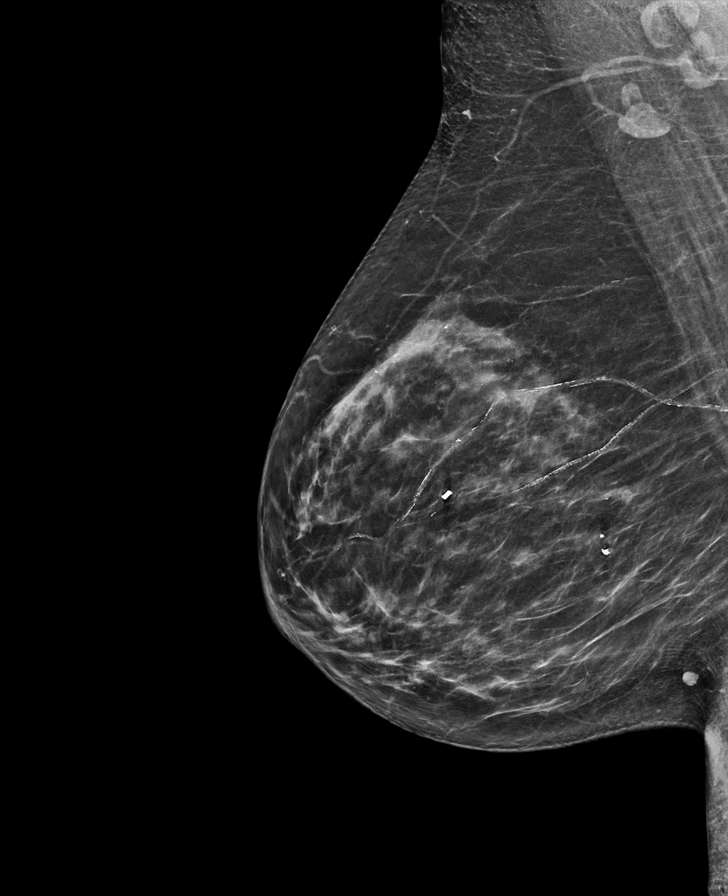

[R CC synth-2D]
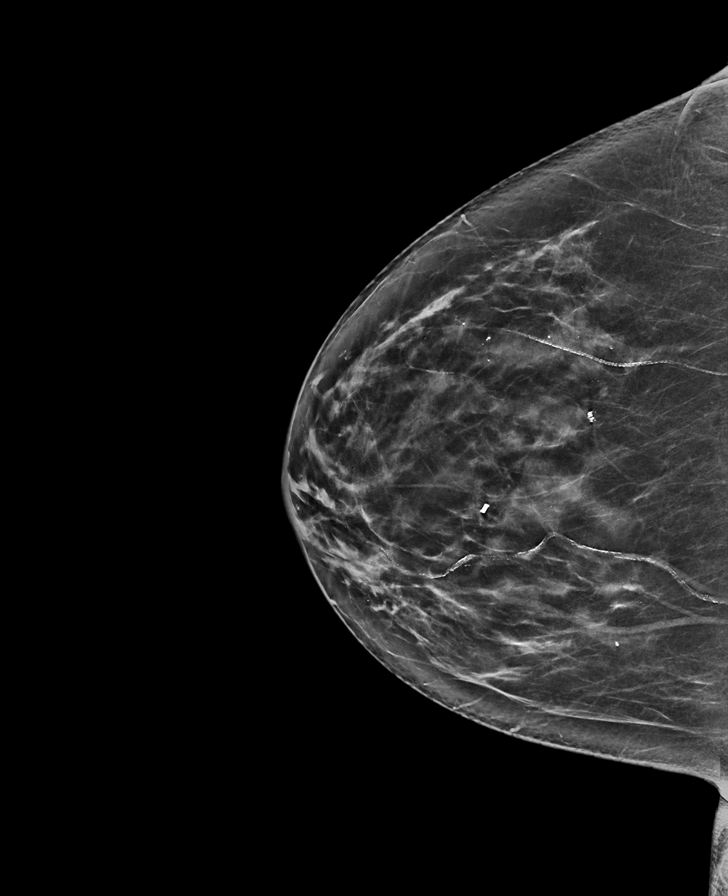

[L MLO synth-2D]
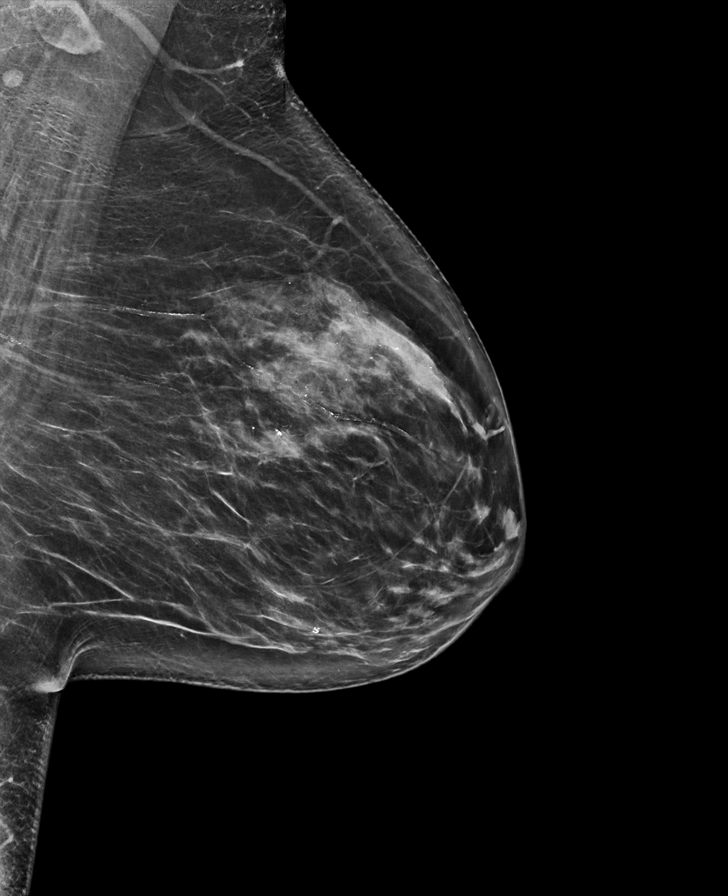

[L CC synth-2D]
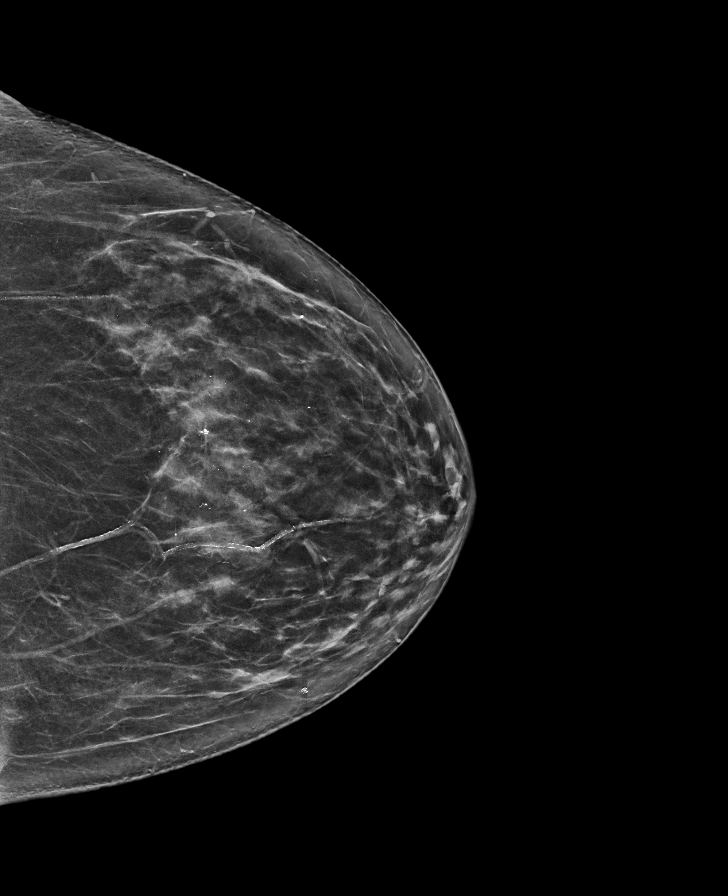

[L MLO tomo · tomo slice 41/80.0]
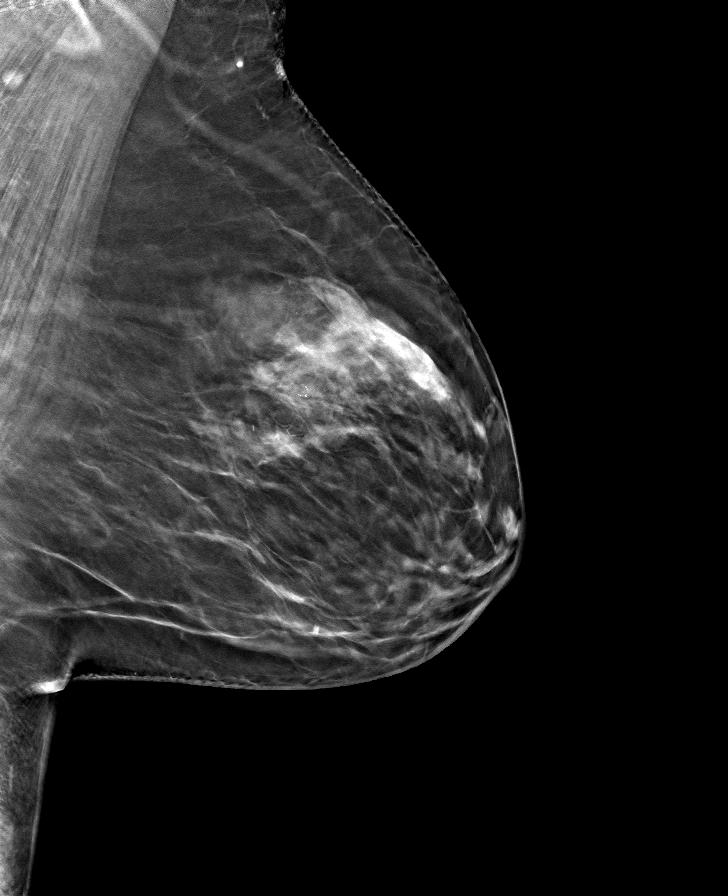

[R MLO tomo · tomo slice 33/65.0]
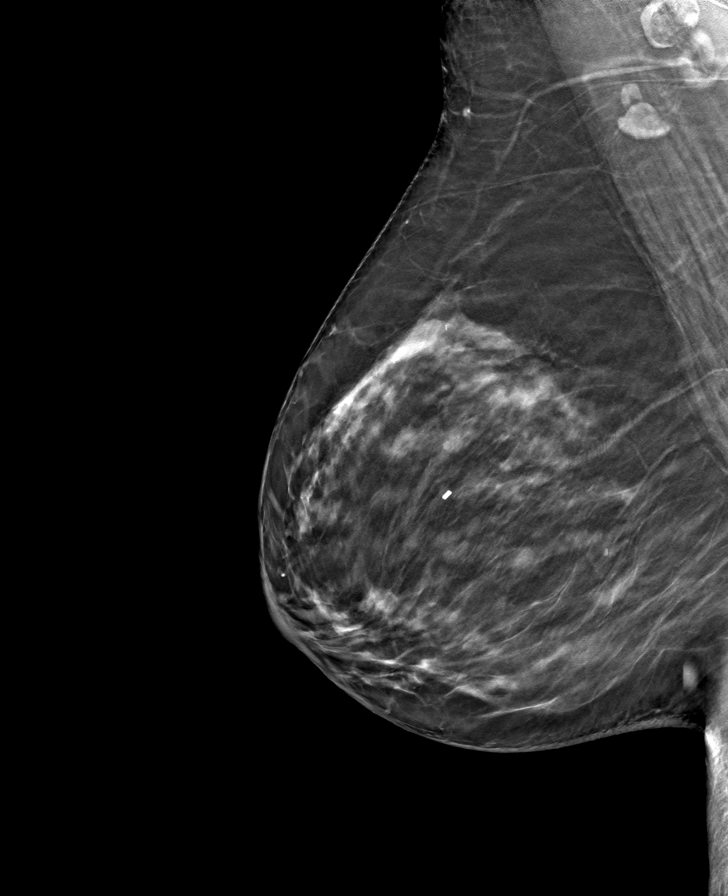

[L CC tomo · tomo slice 37/72.0]
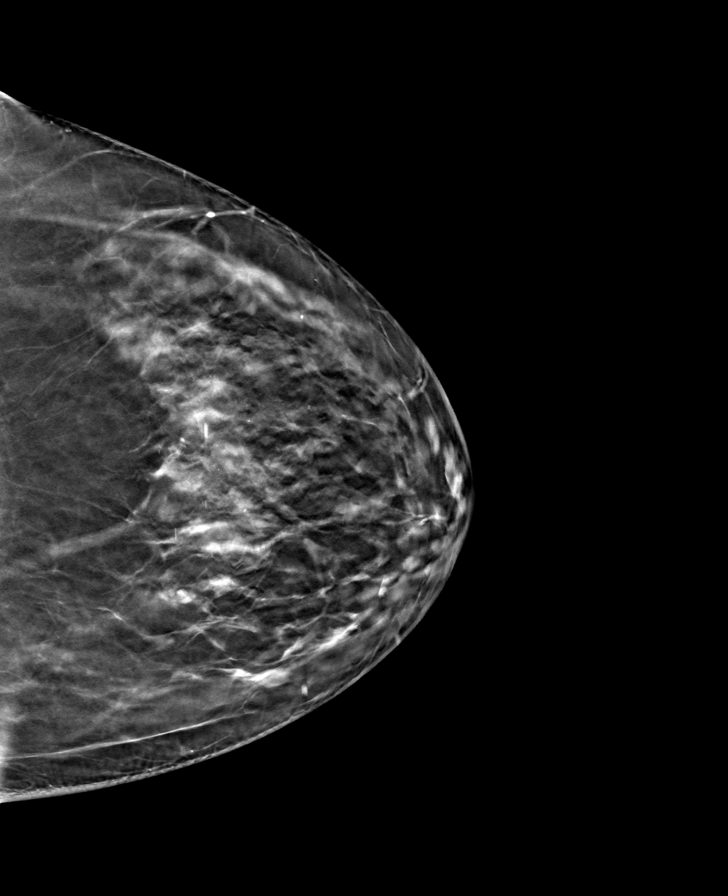

[R CC tomo · tomo slice 37/72.0]
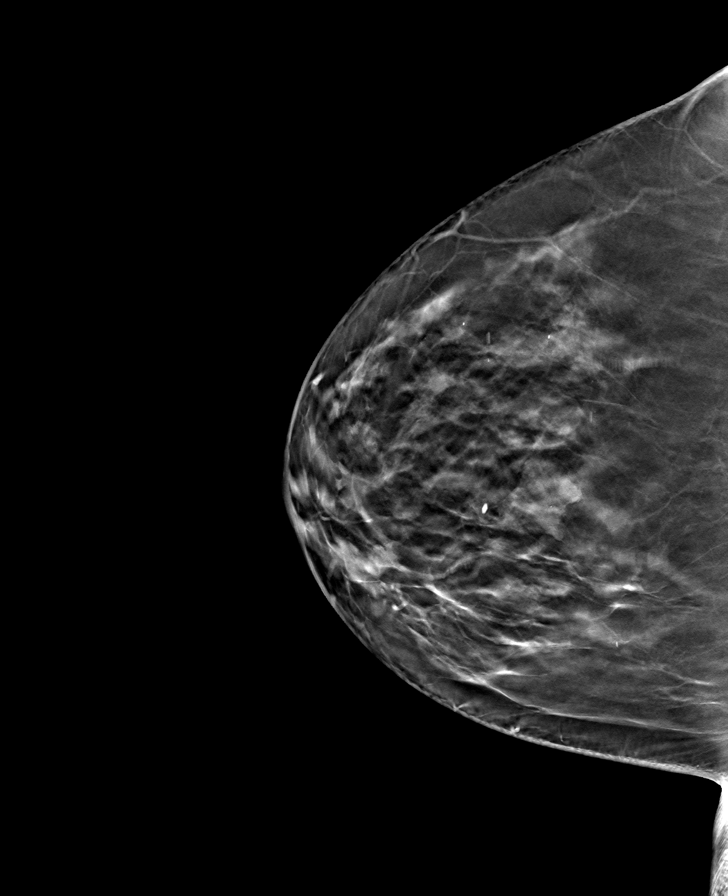

[8 of 24 positions shown; findings below may reference images not displayed]

ACR Breast Density Category c: The breast tissue is heterogeneously
dense, which may obscure small masses.
FINDINGS: There are no findings suspicious for malignancy.
IMPRESSION: No mammographic evidence of malignancy. A result letter of this
screening mammogram will be mailed directly to the patient.

RECOMMENDATION:
Screening mammogram in one year. (Code:Q3-[id number])

BI-RADS CATEGORY  1: Negative.

## 2024-04-12 ENCOUNTER — Other Ambulatory Visit: Payer: Self-pay | Admitting: Internal Medicine

## 2024-04-12 DIAGNOSIS — Z1231 Encounter for screening mammogram for malignant neoplasm of breast: Secondary | ICD-10-CM

## 2024-06-12 ENCOUNTER — Encounter
